# Patient Record
Sex: Male | Born: 1945 | Race: White | Hispanic: No | Marital: Married | Smoking: Never smoker
Health system: Southern US, Community
[De-identification: ages and names within clinical notes are randomized; demographics above are authoritative.]

## PROBLEM LIST (undated history)

## (undated) DIAGNOSIS — S82301A Unspecified fracture of lower end of right tibia, initial encounter for closed fracture: Secondary | ICD-10-CM

## (undated) DIAGNOSIS — I609 Nontraumatic subarachnoid hemorrhage, unspecified: Secondary | ICD-10-CM

## (undated) DIAGNOSIS — C4491 Basal cell carcinoma of skin, unspecified: Secondary | ICD-10-CM

## (undated) DIAGNOSIS — S2249XA Multiple fractures of ribs, unspecified side, initial encounter for closed fracture: Secondary | ICD-10-CM

## (undated) DIAGNOSIS — S022XXA Fracture of nasal bones, initial encounter for closed fracture: Secondary | ICD-10-CM

## (undated) DIAGNOSIS — Z87442 Personal history of urinary calculi: Secondary | ICD-10-CM

## (undated) DIAGNOSIS — I6200 Nontraumatic subdural hemorrhage, unspecified: Secondary | ICD-10-CM

## (undated) DIAGNOSIS — S8261XA Displaced fracture of lateral malleolus of right fibula, initial encounter for closed fracture: Secondary | ICD-10-CM

## (undated) DIAGNOSIS — H9190 Unspecified hearing loss, unspecified ear: Secondary | ICD-10-CM

## (undated) DIAGNOSIS — J939 Pneumothorax, unspecified: Secondary | ICD-10-CM

## (undated) DIAGNOSIS — I82403 Acute embolism and thrombosis of unspecified deep veins of lower extremity, bilateral: Secondary | ICD-10-CM

## (undated) HISTORY — PX: COLONOSCOPY: SHX174

## (undated) HISTORY — PX: TONSILLECTOMY: SUR1361

## (undated) HISTORY — PX: OTHER SURGICAL HISTORY: SHX169

## (undated) HISTORY — PX: SKIN CANCER EXCISION: SHX779

---

## 2006-11-11 HISTORY — PX: TOTAL KNEE ARTHROPLASTY: SHX125

## 2007-01-27 ENCOUNTER — Inpatient Hospital Stay (HOSPITAL_COMMUNITY): Admission: RE | Admit: 2007-01-27 | Discharge: 2007-01-30 | Payer: Self-pay | Admitting: Orthopaedic Surgery

## 2007-11-12 HISTORY — PX: TOTAL KNEE ARTHROPLASTY: SHX125

## 2007-11-12 HISTORY — PX: CHOLECYSTECTOMY: SHX55

## 2008-04-25 ENCOUNTER — Ambulatory Visit: Admission: RE | Admit: 2008-04-25 | Discharge: 2008-04-25 | Payer: Self-pay | Admitting: Orthopaedic Surgery

## 2008-05-19 ENCOUNTER — Inpatient Hospital Stay (HOSPITAL_COMMUNITY): Admission: RE | Admit: 2008-05-19 | Discharge: 2008-05-22 | Payer: Self-pay | Admitting: Orthopaedic Surgery

## 2011-03-26 NOTE — Op Note (Signed)
NAME:  Ryan Shah, Ryan Shah               ACCOUNT NO.:  [id number]   MEDICAL RECORD NO.:  [id number]          PATIENT TYPE:  INP   LOCATION:  5041                         FACILITY:  [facility]   PHYSICIAN:  Lubertha Basque. Dalldorf, M.D.DATE OF BIRTH:  [birth year]   DATE OF PROCEDURE:  05/19/2008  DATE OF DISCHARGE:                               OPERATIVE REPORT   PREOPERATIVE DIAGNOSIS:  Left knee degenerative joint disease.   POSTOPERATIVE DIAGNOSIS:  Left knee degenerative joint disease.   PROCEDURE:  Left total knee replacement.   ANESTHESIA:  General and block.   ATTENDING SURGEON:  Lubertha Basque. 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  It is about a year from a  successful knee replacement on the opposite side.  He has failed  conservative measures on the left including oral anti-inflammatories and  injectables.  Pain was limiting his ability to walk and rest.  He has  bone-on-bone contact on x-ray and is offered a knee replacement  operation.  Informed operative consent was obtained after discussion of  possible complications including reaction to anesthesia, infection, DVT,  PE, and death.  The importance of the postoperative rehabilitation  protocol to optimize result was also stressed with the patient.   SUMMARY FINDINGS AND PROCEDURE:  Under general anesthesia and a knee  block, a left knee replacement was performed.  He had advanced  degenerative change in medial and patellofemoral compartments and  excellent bone quality.  We addressed his problem with parts similar to  the opposite side with the DePuy system utilized.  We used a 6 MBT  tibial tray with a 13 x 30 stem.  We used 10-mm deep dish spacer and a  large plus femur with a 41 mm all-polyethylene patella.  I did include  antibiotic in the cement.  Bryna Colander assisted throughout and was  invaluable to the  completion of the case and that he helped position and  retract while I performed the procedure.  He also closed simultaneously  to help minimize OR time.   DESCRIPTION OF PROCEDURE:  The patient was taken to the operative suite  where general anesthetic was applied without difficulty.  He was also  given a block in the preanesthesia area.  He was positioned supine and  prepped and draped in normal sterile fashion.  After administration of  IV Kefzol, the left leg was elevated, exsanguinated, and tourniquet was  inflated about the thigh.  A longitudinal anterior incision was made  with dissection down to the extensor mechanism.  All appropriate anti-  infected measures were used including Betadine impregnated drape, closed  hooded exhaust systems for each member of surgical team, and  preoperative IV antibiotic.  A medial parapatellar incision was made.  The kneecap was flipped and the knee flexed.  Findings were as noted  above.  An intramedullary guide was placed in the tibia to make cut with  a slight posterior tilt.  An intramedullary guide was then placed in  the  femur to make anterior and posterior cuts creating a flexion gap of 10  mm.  A second intramedullary guide was placed in the femur to make a  distal cut creating equal extension gap of 10 mm balancing the knee.  We  did perform a slight soft tissue release off the proximal medial tibia  to address his mild varus deformity.  The femur sized to a large plus  and the tibia to a 6.  The appropriate guides were placed and utilized.  We elected to place a stem on the tibia due to his stature.  The  appropriate guides were placed there and utilized.  The trial reduction  was done of these components with a 10 spacer and knee easily came to  full extension and flexed well.  The patella was cut down thickness by  about 12 mm to 14 and appropriate guides were placed and utilized for  41.  Trial components removed followed by pulsatile  lavage irrigation of  all bones.  Cement was mixed including Zinacef and was pressurized onto  the bones followed by placement of the aforementioned DePuy components.  Excess cement was trimmed and pressure was held in component until  cement had hardened.  The tourniquet was deflated and a small amount of  bleeding was easily controlled by Bovie cautery.  The knee was irrigated  followed by placement of a drain exiting superolaterally.  The extensor  mechanism reapproximated with #1 Vicryl interrupted fashion followed by  subcutaneous reapproximation with 0 and 2-0 undyed Vicryl and skin was  closed with staples.  He easily flexed to 120 against gravity at the end  of the case.  Adaptic was applied followed by dry gauze and loose Ace  wrap.  Estimated blood loss and fluids can be obtained from anesthesia  records as can accurate tourniquet time.   DISPOSITION:  The patient was extubated in the operating room and taken  to the recovery in stable addition.  He has been admitted to the  Orthopedic Surgery Service for appropriate postop care to include  perioperative antibiotics and Coumadin plus Lovenox for DVT prophylaxis.      Lubertha Basque Jerl Santos, M.D.  Electronically Signed     PGD/MEDQ  D:  05/19/2008  T:  05/20/2008  Job:  [id number]

## 2011-03-29 NOTE — Op Note (Signed)
NAME:  Ryan Shah, Ryan Shah               ACCOUNT NO.:  [id number]   MEDICAL RECORD NO.:  [id number]          PATIENT TYPE:  INP   LOCATION:  2550                         FACILITY:  [facility]   PHYSICIAN:  Lubertha Basque. Dalldorf, M.D.DATE OF BIRTH:  08/19/1946   DATE OF PROCEDURE:  01/27/2007  DATE OF DISCHARGE:                               OPERATIVE REPORT   PREOPERATIVE DIAGNOSIS:  Right knee degenerative arthritis.   POSTOPERATIVE DIAGNOSIS:  Right knee degenerative arthritis.   PROCEDURE:  Right total knee replacement.   ANESTHESIA:  General.   ATTENDING SURGEON:  Lubertha Basque. Jerl Santos, M.D.   ASSISTANT:  Lindwood Qua, P.A.-C.   INDICATIONS FOR PROCEDURE:  The patient is a 65 year old male with a  long history of bilateral knee arthritis.  This has persisted despite  oral anti-inflammatories and multiple injections.  He has had an  arthroscopy on the right which revealed degenerative changes which were  near end stage many years ago.  He also has had an open procedure on the  left which sounds like a meniscectomy.  At this point, his pain limits  his ability to walk and rest and he is offered knee replacement on the  most painful side, which is the right.  Informed operative consent was  obtained after a discussion of the possible complications of reaction to  anesthesia, infection, DVT, PE, and death.   SUMMARY OF FINDINGS AND PROCEDURE:  Under general anesthesia through an  anterior approach, a right total knee replacement was performed.  The  patient had severe degenerative change medial and patellofemoral.  He  had excellent bone quality.  We addressed this problem with a cemented  DePuy system using a large plus femur, 41 all polyethylene patella, 10  mm deep dish spacer, and a size 5 MBT revision stem tibial tray.  Bryna Colander assisted throughout and was invaluable to the completion of the  case in that he helped position and retract while I performed the  procedure.  He also  closed simultaneously to help minimize OR time.  We  did place antibiotic in the cement.   DESCRIPTION OF PROCEDURE:  The patient was brought to the operating  suite where general anesthetic was applied without difficulty.  He was  positioned supine and prepped and draped in a normal sterile fashion.  After the administration of IV Kefzol, the right leg was elevated,  exsanguinated, and the tourniquet inflated about the thigh.  A  longitudinal anterior incision was made with dissection down to the  extensor mechanism.  All appropriate anti-infective measures were used  including closed hooded exhaust systems for each member of the surgical  team, Betadine impregnated drape, and the preoperative IV antibiotic. A  medial parapatellar incision was made and the kneecap flipped with the  knee flexed.  Some residual meniscal tissues were removed along with the  intact ACL and PCL.  He had advanced degenerative changes as described  above.  The bone quality was excellent.  Due to the patient's  significant size, we elected to perform a stemmed tibial component.  We  placed an intramedullary guide  in the tibia and utilized this to make a  cut with a very slight posterior tilt.  An intramedullary guide was then  placed in the femur to make anterior and posterior cuts creating a  flexion gap of 10 mm.  A second intramedullary guide was placed in the  femur to make a distal cut creating an equal extension gap of 10 mm  balancing the knee.  He did have a slight flexion contracture so we set  him up fairly loose.  The femur sized to a large plus while the tibia  sized to a 5 and the appropriate guides were placed and utilized.  The  patella was cut down in thickness by 10 mm to 17 and sized to a 41 with  the appropriate guide placed and utilized.  A trial reduction was done  with these components and the 10 mm spacer.  He easily came to slight  hyperextension and flexed well with the kneecap tracking  in a normal  position.  The trial components were removed and all cut bony surfaces  were lavaged with pulsatile solution.  We mixed the cement including  Zinacef antibiotic and pressurized this onto all three cut bony  surfaces.  We then placed the aforementioned components.  Excess cement  was trimmed and pressure was held on the components until the cement had  hardened.  The tourniquet was deflated and a small amount of bleeding  was easily controlled with Bovie cautery.  The knee was thoroughly  irrigated followed by placement of a drain exiting superolaterally.  The  extensor mechanism was reapproximated with #1 Vicryl in an interrupted  fashion.  The subcutaneous tissues were reapproximated in two layers  with 0 and 2-0 undyed Vicryl followed by skin closure with staples.  Adaptic was applied to the wound followed by dry gauze and loose Ace  wrap.  Estimated blood loss and interoperative fluids could  be obtained  from anesthesia records as can accurate tourniquet time which was about  1 hour.   DISPOSITION:  The patient was extubated in the operating room and taken  to the recovery room in stable addition.  He was admitted to the  orthopedic surgery service for the appropriate postop care to include  perioperative antibiotics and Coumadin plus Lovenox for DVT prophylaxis.      Lubertha Basque Jerl Santos, M.D.  Electronically Signed     PGD/MEDQ  D:  01/27/2007  T:  01/27/2007  Job:  [id number]

## 2011-03-29 NOTE — Discharge Summary (Signed)
NAME:  Ryan Shah, Ryan Shah               ACCOUNT NO.:  [id number]   MEDICAL RECORD NO.:  [id number]          PATIENT TYPE:  INP   LOCATION:  5009                         FACILITY:  [facility]   PHYSICIAN:  Lubertha Basque. Dalldorf, M.D.DATE OF BIRTH:  01-21-1946   DATE OF ADMISSION:  01/27/2007  DATE OF DISCHARGE:  01/30/2007                               DISCHARGE SUMMARY   ADMITTING DIAGNOSIS:  1. Right knee end-stage DJD.  2. Left knee degeneration.   BRIEF HISTORY:  Ryan Shah is a 65 year old white male patient known to  our practice who has been having increasing bilateral knee pain.  The  right is certainly worse than the left.  He is now having pain when he  walks, trouble sleeping at night time.  He has been on oral anti-  inflammatory medications with little benefit.  His x-rays reveal end-  stage degeneration of his right knee, bone-on-bone arthritis.  We have  discussed treatment options with that being knee replacement and the  risk of anesthesia, infection, DVT and possible death.   PERTINENT LABORATORY AND X-RAY FINDINGS:  Sodium 136, potassium 3.7,  glucose 113, BUN 7.  Creatinine 0.83, ProTime's drawn serially as he was  on low-dose Coumadin protocol.  WBC 10.6, RBC is 3.94, hemoglobin 12.3.  EKG sinus bradycardia with first-degree AV block.   COURSE IN THE HOSPITAL:  Ryan Shah was admitted postoperatively placed  on IV Ancef 1 gram q.8 h. He was also put on morphine PCA pump,  knee-  high TED's incentive spirometry and then out of bed that evening with  physical therapy and could be weightbearing as tolerated throughout his  hospital stay. He had a variety of oral pain medications which included  ferrous sulfate, acetaminophen and Percocet.  It was also incorporated  the use of a CPM machine and he was on low-dose Coumadin protocol as  well as Lovenox protocol for DVT prophylaxis. The days in the hospital,  the first day postop his blood pressure was 123/73, temperature 98, was  voiding okay, Hemovac drain was functioning.  Lungs were clear.  Abdomen  was soft throughout his hospital stay.  No calf pain.  Dressing changed  and drain was pulled on the second day postop and he was noted to have  no sign of infection. His motion was 0-70 on the CPM machine the second  day. The third day postop he was eating and voiding. His pain was  controlled on oral pain medications.  Home Care for therapy and  ProTime's were arranged.  He was discharged home.   CONDITION ON DISCHARGE:  Improved.   FOLLOW-UP:  He can be out of work.  He is on a low sodium, heart healthy  diet.  May change his dressing daily.  Is up with crutches or walker  assistance. Return to see Dr. Jerl Santos in 10 days and to call our office  at [phone] for  that appointment. Also if there is any sign of infection to call that  same number.  [facility] will provide ProTime's for his Coumadin  as well as physical therapy.  He is given a prescription for Percocet  one or two every 4-6 hours as needed and then a prescription for  Coumadin dose regulated by pharmacy for 2 weeks.      Lindwood Qua, P.A.      Lubertha Basque Jerl Santos, M.D.  Electronically Signed    MC/MEDQ  D:  01/30/2007  T:  01/30/2007  Job:  [id number]

## 2011-03-29 NOTE — Op Note (Signed)
NAME:  Ryan Shah, Ryan Shah               ACCOUNT NO.:  [id number]   MEDICAL RECORD NO.:  [id number]          PATIENT TYPE:  INP   LOCATION:  2550                         FACILITY:  [facility]   PHYSICIAN:  Lubertha Basque. Dalldorf, M.D.DATE OF BIRTH:  1946/06/07   DATE OF PROCEDURE:  DATE OF DISCHARGE:                               OPERATIVE REPORT   NO DICTATION      Lubertha Basque. Jerl Santos, M.D.     PGD/MEDQ  D:  01/27/2007  T:  01/27/2007  Job:  [id number]

## 2011-03-29 NOTE — Discharge Summary (Signed)
NAME:  Santaella, Nilay               ACCOUNT NO.:  [id number]   MEDICAL RECORD NO.:  [id number]          PATIENT TYPE:  INP   LOCATION:  5041                         FACILITY:  [facility]   PHYSICIAN:  Lubertha Basque. Dalldorf, M.D.DATE OF BIRTH:  [birth year]   DATE OF ADMISSION:  05/19/2008  DATE OF DISCHARGE:  05/22/2008                               DISCHARGE SUMMARY   ADMITTING DIAGNOSES:  1. End-stage degenerative joint disease, left knee  2. Status post right total knee replacement.   BRIEF HISTORY:  Mr. Ryan Shah is a 65 year old white male patient well known  to our practice about a year or so ago.  We had replaced his right knee  which has done fantastically.  His left knee is bothering him now.  He  has end-stage DJD on x-ray.  We would like to proceed on with a left  knee replacement.   </   PERTINENT LABORATORY AND X-RAY FINDINGS:  EKG, sinus bradycardia, first-  degree AV block,  WBCs 9.8, hemoglobin 13.0, hematocrit 37.8, and  platelets 161,000.  Serial INRs were done as he is on low-dose Coumadin  protocol for DVT prophylaxis.  Sodium 137, potassium 3.8, glucose 98,  BUN 8, and creatinine 0.93.   COURSE IN THE HOSPITAL:  He was admitted postoperatively.  Coumadin,  Lovenox, and DVT prophylaxis protocol per pharmacy.  IV Ancef 1 g q.8 h.  x3 doses and then a Dilaudid pump PCA.  Kept on other p.o. medicines  including pain medicine, antiemetics, IV p.o.  Foley catheter for the  first day postop, CPM machine, knee-high TEDs, and incentive spirometry  the first day postop.  He was doing fine.  Pain was controlled by  analgesics.  Lungs were clear.  Abdomen was soft.  Temperature 97, pulse  regular, and blood pressure 93/58.  Voiding okay.  PCA was working, and  he was in the [employer].  Dressing will be changed in the next day.  On the second day postop, his wound was benign.  Hemovac was pulled.  No  sign of infection or irritation.  No adverse feature noted.  Heplocked  is IV.  He  was weightbearing as tolerated and was discharged home in  extubated condition.  Discharge is improved.   FOLLOWUP:  He was kept on Percocet as needed for pain one or two q.4-6  and Coumadin for 2 weeks according to pharmacy protocol.  Home therapy  and INR is arranged through home agency.  He may change his dressing  daily.  Low-sodium, heart-healthy diet.  Crutches, weightbearing as  tolerated, and return to our office [phone] in 10 days.      Lindwood Qua, P.A.      Lubertha Basque Jerl Santos, M.D.  Electronically Signed    MC/MEDQ  D:  06/28/2008  T:  06/28/2008  Job:  [phone] [phone]

## 2011-08-08 LAB — BASIC METABOLIC PANEL
BUN: 10
CO2: 29
CO2: 32
Calcium: 8 — ABNORMAL LOW
Chloride: 100
Chloride: 101
GFR calc Af Amer: >60
GFR calc Af Amer: >60
GFR calc Af Amer: >60
GFR calc non Af Amer: >60
GFR calc non Af Amer: >60
Potassium: 3.8
Potassium: 3.8
Potassium: 4
Sodium: 137
Sodium: 138

## 2011-08-08 LAB — DIFFERENTIAL
Eosinophils Absolute: 0.1
Lymphocytes Relative: 39
Lymphs Abs: 2.8
Monocytes Relative: 9
Neutro Abs: 3.7
Neutrophils Relative %: 51

## 2011-08-08 LAB — CBC
HCT: 37.8 — ABNORMAL LOW
HCT: 38.7 — ABNORMAL LOW
HCT: 39.4
Hemoglobin: 13
Hemoglobin: 13.1
MCHC: 34
MCHC: 33.8
MCV: 91.5
MCV: 91.1
MCV: 91.6
MCV: 91.6
Platelets: 199
Platelets: 171
Platelets: 210
RBC: 4.81
RBC: 4.12 — ABNORMAL LOW
RBC: 4.19 — ABNORMAL LOW
RBC: 4.3
RBC: 5.17
RDW: 13.1
WBC: 11.3 — ABNORMAL HIGH
WBC: 7.2
WBC: 9.8

## 2011-08-08 LAB — PROTIME-INR
INR: 1.1
INR: 1.8 — ABNORMAL HIGH
Prothrombin Time: 14.6

## 2013-04-02 DIAGNOSIS — S82301A Unspecified fracture of lower end of right tibia, initial encounter for closed fracture: Secondary | ICD-10-CM

## 2013-04-02 DIAGNOSIS — S81809A Unspecified open wound, unspecified lower leg, initial encounter: Secondary | ICD-10-CM

## 2013-04-02 DIAGNOSIS — S82209A Unspecified fracture of shaft of unspecified tibia, initial encounter for closed fracture: Secondary | ICD-10-CM | POA: Insufficient documentation

## 2013-04-02 HISTORY — DX: Unspecified open wound, unspecified lower leg, initial encounter: S81.809A

## 2013-04-02 HISTORY — DX: Unspecified fracture of lower end of right tibia, initial encounter for closed fracture: S82.301A

## 2013-04-08 ENCOUNTER — Ambulatory Visit (HOSPITAL_COMMUNITY): Payer: Medicare Other | Admitting: Anesthesiology

## 2013-04-08 ENCOUNTER — Encounter: Payer: Self-pay | Admitting: Orthopedic Surgery

## 2013-04-08 ENCOUNTER — Ambulatory Visit (HOSPITAL_COMMUNITY): Payer: Medicare Other

## 2013-04-08 ENCOUNTER — Ambulatory Visit (HOSPITAL_COMMUNITY)
Admission: AD | Admit: 2013-04-08 | Discharge: 2013-04-08 | Disposition: A | Discharge status: Home / Self Care | Payer: Medicare Other | Source: Ambulatory Visit | Attending: Orthopedic Surgery | Admitting: Orthopedic Surgery

## 2013-04-08 ENCOUNTER — Encounter (HOSPITAL_COMMUNITY)
Admission: AD | Disposition: A | Discharge status: Home / Self Care | Payer: Self-pay | Source: Ambulatory Visit | Attending: Orthopedic Surgery

## 2013-04-08 ENCOUNTER — Encounter (HOSPITAL_COMMUNITY): Payer: Self-pay | Admitting: Anesthesiology

## 2013-04-08 DIAGNOSIS — S82209A Unspecified fracture of shaft of unspecified tibia, initial encounter for closed fracture: Secondary | ICD-10-CM | POA: Insufficient documentation

## 2013-04-08 DIAGNOSIS — S93439A Sprain of tibiofibular ligament of unspecified ankle, initial encounter: Secondary | ICD-10-CM | POA: Insufficient documentation

## 2013-04-08 DIAGNOSIS — S93431A Sprain of tibiofibular ligament of right ankle, initial encounter: Secondary | ICD-10-CM

## 2013-04-08 DIAGNOSIS — S82871A Displaced pilon fracture of right tibia, initial encounter for closed fracture: Secondary | ICD-10-CM

## 2013-04-08 DIAGNOSIS — I739 Peripheral vascular disease, unspecified: Secondary | ICD-10-CM | POA: Insufficient documentation

## 2013-04-08 DIAGNOSIS — Z7901 Long term (current) use of anticoagulants: Secondary | ICD-10-CM | POA: Insufficient documentation

## 2013-04-08 DIAGNOSIS — Z86718 Personal history of other venous thrombosis and embolism: Secondary | ICD-10-CM | POA: Insufficient documentation

## 2013-04-08 DIAGNOSIS — Z87820 Personal history of traumatic brain injury: Secondary | ICD-10-CM | POA: Insufficient documentation

## 2013-04-08 HISTORY — DX: Nontraumatic subdural hemorrhage, unspecified: I62.00

## 2013-04-08 HISTORY — DX: Acute embolism and thrombosis of unspecified deep veins of lower extremity, bilateral: I82.403

## 2013-04-08 HISTORY — DX: Displaced fracture of lateral malleolus of right fibula, initial encounter for closed fracture: S82.61XA

## 2013-04-08 HISTORY — DX: Nontraumatic subarachnoid hemorrhage, unspecified: I60.9

## 2013-04-08 HISTORY — DX: Personal history of urinary calculi: Z87.442

## 2013-04-08 HISTORY — DX: Pneumothorax, unspecified: J93.9

## 2013-04-08 HISTORY — DX: Fracture of nasal bones, initial encounter for closed fracture: S02.2XXA

## 2013-04-08 HISTORY — DX: Multiple fractures of ribs, unspecified side, initial encounter for closed fracture: S22.49XA

## 2013-04-08 HISTORY — DX: Unspecified fracture of lower end of right tibia, initial encounter for closed fracture: S82.301A

## 2013-04-08 HISTORY — DX: Rider (driver) (passenger) of other motorcycle injured in unspecified traffic accident, initial encounter: V29.99XA

## 2013-04-08 HISTORY — PX: ORIF ANKLE FRACTURE: SHX5408

## 2013-04-08 HISTORY — DX: Basal cell carcinoma of skin, unspecified: C44.91

## 2013-04-08 LAB — CBC WITH DIFFERENTIAL/PLATELET
Eosinophils Absolute: 0.1 10*3/uL (ref 0.0–0.7)
Eosinophils Relative: 1 % (ref 0–5)
HCT: 39.4 % (ref 39.0–52.0)
Hemoglobin: 13.2 g/dL (ref 13.0–17.0)
Lymphs Abs: 2.1 10*3/uL (ref 0.7–4.0)
MCH: 30.8 pg (ref 26.0–34.0)
MCV: 92.1 fL (ref 78.0–100.0)
Monocytes Absolute: 0.7 10*3/uL (ref 0.1–1.0)
Monocytes Relative: 9 % (ref 3–12)
Neutrophils Relative %: 62 % (ref 43–77)
RBC: 4.28 MIL/uL (ref 4.22–5.81)

## 2013-04-08 LAB — PROTIME-INR: Prothrombin Time: 14.5 seconds (ref 11.6–15.2)

## 2013-04-08 LAB — COMPREHENSIVE METABOLIC PANEL
Alkaline Phosphatase: 214 U/L — ABNORMAL HIGH (ref 39–117)
BUN: 16 mg/dL (ref 6–23)
GFR calc Af Amer: 79 mL/min — ABNORMAL LOW (ref >90)
Glucose, Bld: 98 mg/dL (ref 70–99)
Potassium: 4.4 mEq/L (ref 3.5–5.1)
Total Protein: 7 g/dL (ref 6.0–8.3)

## 2013-04-08 LAB — SURGICAL PCR SCREEN: Staphylococcus aureus: NEGATIVE

## 2013-04-08 SURGERY — OPEN REDUCTION INTERNAL FIXATION (ORIF) ANKLE FRACTURE
Anesthesia: General | Site: Ankle | Laterality: Right | Wound class: Clean

## 2013-04-08 MED ORDER — CEFAZOLIN SODIUM-DEXTROSE 2-3 GM-% IV SOLR
INTRAVENOUS | Status: AC
  Administered 2013-04-08: 2 g via INTRAVENOUS
  Filled 2013-04-08: qty 50

## 2013-04-08 MED ORDER — METHOCARBAMOL 500 MG PO TABS: 500.0000 mg | ORAL_TABLET | Freq: Four times a day (QID) | ORAL | Status: DC | PRN

## 2013-04-08 MED ORDER — MUPIROCIN 2 % EX OINT: TOPICAL_OINTMENT | Freq: Two times a day (BID) | CUTANEOUS | Status: DC

## 2013-04-08 MED ORDER — BUPIVACAINE HCL (PF) 0.25 % IJ SOLN
INTRAMUSCULAR | Status: DC | PRN
  Administered 2013-04-08: 10 mL

## 2013-04-08 MED ORDER — HYDROCODONE-ACETAMINOPHEN 5-325 MG PO TABS: 1.0000 | ORAL_TABLET | Freq: Four times a day (QID) | ORAL | Status: DC | PRN

## 2013-04-08 MED ORDER — BUPIVACAINE HCL (PF) 0.25 % IJ SOLN
INTRAMUSCULAR | Status: AC
  Filled 2013-04-08: qty 30

## 2013-04-08 MED ORDER — PROPOFOL 10 MG/ML IV BOLUS
INTRAVENOUS | Status: DC | PRN
  Administered 2013-04-08: 50 mg via INTRAVENOUS
  Administered 2013-04-08: 150 mg via INTRAVENOUS

## 2013-04-08 MED ORDER — MUPIROCIN 2 % EX OINT
TOPICAL_OINTMENT | CUTANEOUS | Status: AC
  Filled 2013-04-08: qty 22

## 2013-04-08 MED ORDER — HYDROMORPHONE HCL PF 1 MG/ML IJ SOLN
INTRAMUSCULAR | Status: AC
  Filled 2013-04-08: qty 2

## 2013-04-08 MED ORDER — ROCURONIUM BROMIDE 100 MG/10ML IV SOLN
INTRAVENOUS | Status: DC | PRN
  Administered 2013-04-08: 30 mg via INTRAVENOUS
  Administered 2013-04-08 (×2): 10 mg via INTRAVENOUS

## 2013-04-08 MED ORDER — FENTANYL CITRATE 0.05 MG/ML IJ SOLN
INTRAMUSCULAR | Status: DC | PRN
  Administered 2013-04-08: 100 ug via INTRAVENOUS
  Administered 2013-04-08 (×3): 50 ug via INTRAVENOUS

## 2013-04-08 MED ORDER — ONDANSETRON HCL 4 MG/2ML IJ SOLN
INTRAMUSCULAR | Status: DC | PRN
  Administered 2013-04-08: 4 mg via INTRAVENOUS

## 2013-04-08 MED ORDER — 0.9 % SODIUM CHLORIDE (POUR BTL) OPTIME
TOPICAL | Status: DC | PRN
  Administered 2013-04-08: 1000 mL

## 2013-04-08 MED ORDER — LACTATED RINGERS IV SOLN: INTRAVENOUS | Status: DC

## 2013-04-08 MED ORDER — CEFAZOLIN SODIUM-DEXTROSE 2-3 GM-% IV SOLR
INTRAVENOUS | Status: AC
  Filled 2013-04-08: qty 50

## 2013-04-08 MED ORDER — EPHEDRINE SULFATE 50 MG/ML IJ SOLN
INTRAMUSCULAR | Status: DC | PRN
  Administered 2013-04-08: 5 mg via INTRAVENOUS

## 2013-04-08 MED ORDER — LIDOCAINE HCL (CARDIAC) 20 MG/ML IV SOLN
INTRAVENOUS | Status: DC | PRN
  Administered 2013-04-08: 60 mg via INTRAVENOUS

## 2013-04-08 MED ORDER — NEOSTIGMINE METHYLSULFATE 1 MG/ML IJ SOLN
INTRAMUSCULAR | Status: DC | PRN
  Administered 2013-04-08: 5 mg via INTRAVENOUS

## 2013-04-08 MED ORDER — CEFAZOLIN SODIUM-DEXTROSE 2-3 GM-% IV SOLR: 2.0000 g | INTRAVENOUS | Status: DC

## 2013-04-08 MED ORDER — HYDROMORPHONE HCL PF 1 MG/ML IJ SOLN
0.2500 mg | INTRAMUSCULAR | Status: DC | PRN
  Administered 2013-04-08: 0.5 mg via INTRAVENOUS

## 2013-04-08 MED ORDER — GLYCOPYRROLATE 0.2 MG/ML IJ SOLN
INTRAMUSCULAR | Status: DC | PRN
  Administered 2013-04-08: .6 mg via INTRAVENOUS

## 2013-04-08 MED ORDER — MIDAZOLAM HCL 5 MG/5ML IJ SOLN
INTRAMUSCULAR | Status: DC | PRN
  Administered 2013-04-08: 2 mg via INTRAVENOUS

## 2013-04-08 MED ORDER — ONDANSETRON HCL 4 MG PO TABS: 4.0000 mg | ORAL_TABLET | Freq: Three times a day (TID) | ORAL | Status: DC | PRN

## 2013-04-08 MED ORDER — LACTATED RINGERS IV SOLN
INTRAVENOUS | Status: DC
  Administered 2013-04-08 (×2): via INTRAVENOUS

## 2013-04-08 MED ORDER — OXYCODONE HCL 5 MG PO TABS: 5.0000 mg | ORAL_TABLET | ORAL | Status: DC | PRN

## 2013-04-08 MED ORDER — ONDANSETRON HCL 4 MG/2ML IJ SOLN: 4.0000 mg | Freq: Once | INTRAMUSCULAR | Status: DC | PRN

## 2013-04-08 SURGICAL SUPPLY — 68 items
BANDAGE ELASTIC 4 VELCRO ST LF (GAUZE/BANDAGES/DRESSINGS) ×2 IMPLANT
BANDAGE ELASTIC 6 VELCRO ST LF (GAUZE/BANDAGES/DRESSINGS) ×2 IMPLANT
BANDAGE ESMARK 6X9 LF (GAUZE/BANDAGES/DRESSINGS) ×1 IMPLANT
BANDAGE GAUZE ELAST BULKY 4 IN (GAUZE/BANDAGES/DRESSINGS) ×3 IMPLANT
BIT DRILL 2.5X110 QC LCP DISP (BIT) ×1 IMPLANT
BIT DRILL QC 3.5X110 (BIT) ×1 IMPLANT
BLADE SURG 10 STRL SS (BLADE) ×2 IMPLANT
BNDG CMPR 9X6 STRL LF SNTH (GAUZE/BANDAGES/DRESSINGS) ×1
BNDG COHESIVE 4X5 TAN STRL (GAUZE/BANDAGES/DRESSINGS) ×2 IMPLANT
BNDG ESMARK 6X9 LF (GAUZE/BANDAGES/DRESSINGS) ×2
BRUSH SCRUB DISP (MISCELLANEOUS) ×4 IMPLANT
CLOTH BEACON ORANGE TIMEOUT ST (SAFETY) ×2 IMPLANT
COVER SURGICAL LIGHT HANDLE (MISCELLANEOUS) ×4 IMPLANT
DRAPE C-ARM 42X72 X-RAY (DRAPES) IMPLANT
DRAPE C-ARMOR (DRAPES) ×2 IMPLANT
DRAPE ORTHO SPLIT 77X108 STRL (DRAPES) ×4
DRAPE PROXIMA HALF (DRAPES) ×2 IMPLANT
DRAPE SURG ORHT 6 SPLT 77X108 (DRAPES) ×3 IMPLANT
DRAPE U-SHAPE 47X51 STRL (DRAPES) ×2 IMPLANT
DRSG ADAPTIC 3X8 NADH LF (GAUZE/BANDAGES/DRESSINGS) ×1 IMPLANT
DRSG EMULSION OIL 3X3 NADH (GAUZE/BANDAGES/DRESSINGS) IMPLANT
ELECT REM PT RETURN 9FT ADLT (ELECTROSURGICAL) ×2
ELECTRODE REM PT RTRN 9FT ADLT (ELECTROSURGICAL) ×1 IMPLANT
GLOVE BIO SURGEON STRL SZ7 (GLOVE) ×2 IMPLANT
GLOVE BIO SURGEON STRL SZ7.5 (GLOVE) ×2 IMPLANT
GLOVE BIO SURGEON STRL SZ8 (GLOVE) ×2 IMPLANT
GLOVE BIOGEL PI IND STRL 7.5 (GLOVE) ×1 IMPLANT
GLOVE BIOGEL PI IND STRL 8 (GLOVE) ×1 IMPLANT
GLOVE BIOGEL PI INDICATOR 7.5 (GLOVE) ×1
GLOVE BIOGEL PI INDICATOR 8 (GLOVE) ×1
GLOVE ECLIPSE 6.5 STRL STRAW (GLOVE) ×1 IMPLANT
GOWN PREVENTION PLUS XLARGE (GOWN DISPOSABLE) ×2 IMPLANT
GOWN STRL NON-REIN LRG LVL3 (GOWN DISPOSABLE) ×4 IMPLANT
KIT BASIN OR (CUSTOM PROCEDURE TRAY) ×2 IMPLANT
KIT ROOM TURNOVER OR (KITS) ×2 IMPLANT
MANIFOLD NEPTUNE II (INSTRUMENTS) ×1 IMPLANT
NDL HYPO 21X1.5 SAFETY (NEEDLE) IMPLANT
NEEDLE HYPO 21X1.5 SAFETY (NEEDLE) ×2 IMPLANT
NS IRRIG 1000ML POUR BTL (IV SOLUTION) ×2 IMPLANT
PACK GENERAL/GYN (CUSTOM PROCEDURE TRAY) ×2 IMPLANT
PAD ARMBOARD 7.5X6 YLW CONV (MISCELLANEOUS) ×4 IMPLANT
PAD CAST 4YDX4 CTTN HI CHSV (CAST SUPPLIES) IMPLANT
PADDING CAST COTTON 4X4 STRL (CAST SUPPLIES) ×8
PADDING CAST COTTON 6X4 STRL (CAST SUPPLIES) ×2 IMPLANT
PENCIL BUTTON HOLSTER BLD 10FT (ELECTRODE) ×2 IMPLANT
SCREW CORTEX 3.5 55MM (Screw) ×4 IMPLANT
SCREW CORTEX 3.5 60MM (Screw) ×2 IMPLANT
SCREW CORTEX 3.5 65MM (Screw) ×1 IMPLANT
SCREW CORTEX 3.5 70MM SELF TAP (Screw) ×1 IMPLANT
SPLINT PLASTER CAST XFAST 5X30 (CAST SUPPLIES) IMPLANT
SPLINT PLASTER XFAST SET 5X30 (CAST SUPPLIES) ×1
SPONGE GAUZE 4X4 12PLY (GAUZE/BANDAGES/DRESSINGS) ×1 IMPLANT
SPONGE LAP 18X18 X RAY DECT (DISPOSABLE) ×3 IMPLANT
SPONGE SCRUB IODOPHOR (GAUZE/BANDAGES/DRESSINGS) ×3 IMPLANT
STAPLER VISISTAT 35W (STAPLE) IMPLANT
SUCTION FRAZIER TIP 10 FR DISP (SUCTIONS) ×2 IMPLANT
SUT ETHILON 2 0 FS 18 (SUTURE) ×4 IMPLANT
SUT ETHILON 3 0 PS 1 (SUTURE) ×4 IMPLANT
SUT PDS AB 2-0 CT1 27 (SUTURE) IMPLANT
SUT VIC AB 2-0 CT1 27 (SUTURE) ×4
SUT VIC AB 2-0 CT1 TAPERPNT 27 (SUTURE) ×2 IMPLANT
SUT VIC AB 2-0 CT3 27 (SUTURE) IMPLANT
SYR CONTROL 10ML LL (SYRINGE) ×1 IMPLANT
TOWEL OR 17X24 6PK STRL BLUE (TOWEL DISPOSABLE) ×2 IMPLANT
TOWEL OR 17X26 10 PK STRL BLUE (TOWEL DISPOSABLE) ×4 IMPLANT
TUBE CONNECTING 12X1/4 (SUCTIONS) ×2 IMPLANT
UNDERPAD 30X30 INCONTINENT (UNDERPADS AND DIAPERS) ×2 IMPLANT
WATER STERILE IRR 1000ML POUR (IV SOLUTION) ×2 IMPLANT

## 2013-04-08 NOTE — Transfer of Care (Signed)
Immediate Anesthesia Transfer of Care Note  Patient: Ryan Shah  Procedure(s) Performed: Procedure(s) with comments: OPEN REDUCTION INTERNAL FIXATION (ORIF) ANKLE FRACTURE (Right) - repair pilon fracture and syndesmosis  Patient Location: PACU  Anesthesia Type:General  Level of Consciousness: sedated  Airway & Oxygen Therapy: Patient Spontanous Breathing and Patient connected to face mask oxygen  Post-op Assessment: Report given to PACU RN and Post -op Vital signs reviewed and stable  Post vital signs: Reviewed and stable  Complications: No apparent anesthesia complications

## 2013-04-08 NOTE — Anesthesia Procedure Notes (Signed)
Procedure Name: Intubation Date/Time: 04/08/2013 3:18 PM Performed by: Elon Alas Pre-anesthesia Checklist: Patient identified, Timeout performed, Emergency Drugs available, Suction available and Patient being monitored Patient Re-evaluated:Patient Re-evaluated prior to inductionOxygen Delivery Method: Circle system utilized Preoxygenation: Pre-oxygenation with 100% oxygen Intubation Type: IV induction Ventilation: Mask ventilation without difficulty and Oral airway inserted - appropriate to patient size Laryngoscope Size: Mac and 4 Grade View: Grade IV Tube type: Oral Tube size: 7.5 mm Number of attempts: 1 Airway Equipment and Method: Stylet Placement Confirmation: positive ETCO2,  ETT inserted through vocal cords under direct vision and breath sounds checked- equal and bilateral Secured at: 23 cm Tube secured with: Tape Dental Injury: Teeth and Oropharynx as per pre-operative assessment
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
## 2013-04-08 NOTE — Anesthesia Postprocedure Evaluation (Signed)
  Anesthesia Post-op Note  Patient: Ryan Shah  Procedure(s) Performed: Procedure(s) with comments: OPEN REDUCTION INTERNAL FIXATION (ORIF) ANKLE FRACTURE (Right) - repair pilon fracture and syndesmosis  Patient Location: PACU  Anesthesia Type:General  Level of Consciousness: awake, oriented and patient cooperative  Airway and Oxygen Therapy: Patient Spontanous Breathing  Post-op Pain: mild  Post-op Assessment: Post-op Vital signs reviewed, Patient's Cardiovascular Status Stable, Respiratory Function Stable, Patent Airway, No signs of Nausea or vomiting and Pain level controlled  Post-op Vital Signs: stable  Complications: No apparent anesthesia complications

## 2013-04-08 NOTE — Preoperative (Signed)
Beta Blockers   Reason not to administer Beta Blockers:Not Applicable 

## 2013-04-08 NOTE — Anesthesia Preprocedure Evaluation (Signed)
Anesthesia Evaluation  Patient identified by MRN, date of birth, ID band Patient awake    Reviewed: Allergy & Precautions, H&P , NPO status , Patient's Chart, lab work & pertinent test results  Airway       Dental   Pulmonary          Cardiovascular + Peripheral Vascular Disease     Neuro/Psych    GI/Hepatic   Endo/Other    Renal/GU      Musculoskeletal   Abdominal   Peds  Hematology   Anesthesia Other Findings   Reproductive/Obstetrics                           Anesthesia Physical Anesthesia Plan  ASA: I  Anesthesia Plan: General   Post-op Pain Management:    Induction: Intravenous  Airway Management Planned: LMA and Oral ETT  Additional Equipment:   Intra-op Plan:   Post-operative Plan: Extubation in OR  Informed Consent: I have reviewed the patients History and Physical, chart, labs and discussed the procedure including the risks, benefits and alternatives for the proposed anesthesia with the patient or authorized representative who has indicated his/her understanding and acceptance.     Plan Discussed with: CRNA, Anesthesiologist and Surgeon  Anesthesia Plan Comments:         Anesthesia Quick Evaluation

## 2013-04-09 ENCOUNTER — Encounter (HOSPITAL_COMMUNITY): Payer: Self-pay | Admitting: Orthopedic Surgery
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
## 2013-04-10 NOTE — H&P (Signed)
I have seen and examined the patient. I agree with the findings above.  I discussed with the patient the risks and benefits of surgery, including the possibility of infection, nerve injury, vessel injury, wound breakdown, arthritis, symptomatic hardware, DVT/ PE, loss of motion, and need for further surgery among others.  We also specifically discussed the elevated risk of soft tissue breakdown that could lead to amputation.  He understood these risks and wished to proceed.  Myrene Galas, MD [facility] [phone] (p)

## 2013-09-10 ENCOUNTER — Encounter (HOSPITAL_BASED_OUTPATIENT_CLINIC_OR_DEPARTMENT_OTHER): Payer: Self-pay | Admitting: *Deleted

## 2013-09-10 ENCOUNTER — Other Ambulatory Visit: Payer: Self-pay | Admitting: Orthopedic Surgery
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
## 2013-09-13 ENCOUNTER — Encounter (HOSPITAL_BASED_OUTPATIENT_CLINIC_OR_DEPARTMENT_OTHER): Payer: No Typology Code available for payment source | Admitting: Anesthesiology

## 2013-09-13 ENCOUNTER — Encounter (HOSPITAL_BASED_OUTPATIENT_CLINIC_OR_DEPARTMENT_OTHER)
Admission: RE | Disposition: A | Discharge status: Home / Self Care | Payer: Self-pay | Source: Ambulatory Visit | Attending: Orthopedic Surgery

## 2013-09-13 ENCOUNTER — Ambulatory Visit (HOSPITAL_BASED_OUTPATIENT_CLINIC_OR_DEPARTMENT_OTHER): Payer: No Typology Code available for payment source | Admitting: Anesthesiology

## 2013-09-13 ENCOUNTER — Ambulatory Visit (HOSPITAL_BASED_OUTPATIENT_CLINIC_OR_DEPARTMENT_OTHER)
Admission: RE | Admit: 2013-09-13 | Discharge: 2013-09-13 | Disposition: A | Discharge status: Home / Self Care | Payer: No Typology Code available for payment source | Source: Ambulatory Visit | Attending: Orthopedic Surgery | Admitting: Orthopedic Surgery

## 2013-09-13 ENCOUNTER — Encounter (HOSPITAL_BASED_OUTPATIENT_CLINIC_OR_DEPARTMENT_OTHER): Payer: Self-pay

## 2013-09-13 DIAGNOSIS — Z85828 Personal history of other malignant neoplasm of skin: Secondary | ICD-10-CM | POA: Insufficient documentation

## 2013-09-13 DIAGNOSIS — Z87442 Personal history of urinary calculi: Secondary | ICD-10-CM | POA: Insufficient documentation

## 2013-09-13 DIAGNOSIS — M25819 Other specified joint disorders, unspecified shoulder: Secondary | ICD-10-CM | POA: Insufficient documentation

## 2013-09-13 DIAGNOSIS — M75101 Unspecified rotator cuff tear or rupture of right shoulder, not specified as traumatic: Secondary | ICD-10-CM

## 2013-09-13 DIAGNOSIS — M7500 Adhesive capsulitis of unspecified shoulder: Secondary | ICD-10-CM | POA: Insufficient documentation

## 2013-09-13 DIAGNOSIS — Z96659 Presence of unspecified artificial knee joint: Secondary | ICD-10-CM | POA: Insufficient documentation

## 2013-09-13 DIAGNOSIS — S43429A Sprain of unspecified rotator cuff capsule, initial encounter: Secondary | ICD-10-CM | POA: Insufficient documentation

## 2013-09-13 DIAGNOSIS — H9190 Unspecified hearing loss, unspecified ear: Secondary | ICD-10-CM | POA: Insufficient documentation

## 2013-09-13 DIAGNOSIS — S43439A Superior glenoid labrum lesion of unspecified shoulder, initial encounter: Secondary | ICD-10-CM | POA: Insufficient documentation

## 2013-09-13 DIAGNOSIS — Z79899 Other long term (current) drug therapy: Secondary | ICD-10-CM | POA: Insufficient documentation

## 2013-09-13 HISTORY — DX: Unspecified hearing loss, unspecified ear: H91.90

## 2013-09-13 HISTORY — PX: SHOULDER ARTHROSCOPY WITH ROTATOR CUFF REPAIR AND SUBACROMIAL DECOMPRESSION: SHX5686

## 2013-09-13 LAB — POCT HEMOGLOBIN-HEMACUE: Hemoglobin: 15.5 g/dL (ref 13.0–17.0)

## 2013-09-13 SURGERY — SHOULDER ARTHROSCOPY WITH ROTATOR CUFF REPAIR AND SUBACROMIAL DECOMPRESSION
Anesthesia: General | Site: Shoulder | Laterality: Right | Wound class: Clean

## 2013-09-13 MED ORDER — MIDAZOLAM HCL 2 MG/2ML IJ SOLN
INTRAMUSCULAR | Status: AC
  Filled 2013-09-13: qty 2

## 2013-09-13 MED ORDER — SUCCINYLCHOLINE CHLORIDE 20 MG/ML IJ SOLN
INTRAMUSCULAR | Status: DC | PRN
  Administered 2013-09-13: 100 mg via INTRAVENOUS

## 2013-09-13 MED ORDER — DEXAMETHASONE SODIUM PHOSPHATE 4 MG/ML IJ SOLN
INTRAMUSCULAR | Status: DC | PRN
  Administered 2013-09-13: 10 mg via INTRAVENOUS

## 2013-09-13 MED ORDER — BUPIVACAINE-EPINEPHRINE PF 0.5-1:200000 % IJ SOLN
INTRAMUSCULAR | Status: DC | PRN
  Administered 2013-09-13: 30 mL

## 2013-09-13 MED ORDER — FENTANYL CITRATE 0.05 MG/ML IJ SOLN
100.0000 ug | Freq: Once | INTRAMUSCULAR | Status: AC
  Administered 2013-09-13: 100 ug via INTRAVENOUS

## 2013-09-13 MED ORDER — FENTANYL CITRATE 0.05 MG/ML IJ SOLN
INTRAMUSCULAR | Status: AC
  Filled 2013-09-13: qty 2

## 2013-09-13 MED ORDER — HYDROMORPHONE HCL PF 1 MG/ML IJ SOLN
1.0000 mg | INTRAMUSCULAR | Status: DC | PRN
  Administered 2013-09-13: 0.5 mg via INTRAVENOUS

## 2013-09-13 MED ORDER — HYDROMORPHONE HCL PF 1 MG/ML IJ SOLN: 0.2500 mg | INTRAMUSCULAR | Status: DC | PRN

## 2013-09-13 MED ORDER — CEFAZOLIN SODIUM-DEXTROSE 2-3 GM-% IV SOLR
INTRAVENOUS | Status: AC
  Filled 2013-09-13: qty 50

## 2013-09-13 MED ORDER — LIDOCAINE HCL (CARDIAC) 20 MG/ML IV SOLN
INTRAVENOUS | Status: DC | PRN
  Administered 2013-09-13: 25 mg via INTRAVENOUS

## 2013-09-13 MED ORDER — LACTATED RINGERS IV SOLN
INTRAVENOUS | Status: DC
  Administered 2013-09-13: 12:00:00 via INTRAVENOUS
  Administered 2013-09-13: 10 mL/h via INTRAVENOUS
  Administered 2013-09-13: 13:00:00 via INTRAVENOUS

## 2013-09-13 MED ORDER — SODIUM CHLORIDE 0.9 % IR SOLN
Status: DC | PRN
  Administered 2013-09-13: 12000 mL

## 2013-09-13 MED ORDER — DOCUSATE SODIUM 100 MG PO CAPS: 100.0000 mg | ORAL_CAPSULE | Freq: Three times a day (TID) | ORAL | Status: DC | PRN

## 2013-09-13 MED ORDER — MIDAZOLAM HCL 2 MG/2ML IJ SOLN
1.0000 mg | INTRAMUSCULAR | Status: DC | PRN
  Administered 2013-09-13: 2 mg via INTRAVENOUS

## 2013-09-13 MED ORDER — CEFAZOLIN SODIUM-DEXTROSE 2-3 GM-% IV SOLR
2.0000 g | Freq: Once | INTRAVENOUS | Status: AC
  Administered 2013-09-13: 2 g via INTRAVENOUS

## 2013-09-13 MED ORDER — ONDANSETRON HCL 4 MG/2ML IJ SOLN
INTRAMUSCULAR | Status: DC | PRN
  Administered 2013-09-13: 4 mg via INTRAVENOUS

## 2013-09-13 MED ORDER — OXYCODONE HCL 5 MG PO TABS: 5.0000 mg | ORAL_TABLET | Freq: Once | ORAL | Status: DC | PRN

## 2013-09-13 MED ORDER — PHENYLEPHRINE HCL 10 MG/ML IJ SOLN
10.0000 mg | INTRAVENOUS | Status: DC | PRN
  Administered 2013-09-13: 50 ug/min via INTRAVENOUS

## 2013-09-13 MED ORDER — PROMETHAZINE HCL 25 MG/ML IJ SOLN: 6.2500 mg | INTRAMUSCULAR | Status: DC | PRN

## 2013-09-13 MED ORDER — MEPERIDINE HCL 25 MG/ML IJ SOLN: 6.2500 mg | INTRAMUSCULAR | Status: DC | PRN

## 2013-09-13 MED ORDER — OXYCODONE HCL 5 MG/5ML PO SOLN: 5.0000 mg | Freq: Once | ORAL | Status: DC | PRN

## 2013-09-13 MED ORDER — PROPOFOL 10 MG/ML IV BOLUS
INTRAVENOUS | Status: DC | PRN
  Administered 2013-09-13: 200 mg via INTRAVENOUS

## 2013-09-13 MED ORDER — OXYCODONE-ACETAMINOPHEN 5-325 MG PO TABS: 1.0000 | ORAL_TABLET | ORAL | Status: DC | PRN

## 2013-09-13 MED ORDER — MIDAZOLAM HCL 2 MG/2ML IJ SOLN: 0.5000 mg | Freq: Once | INTRAMUSCULAR | Status: DC | PRN

## 2013-09-13 MED ORDER — HYDROMORPHONE HCL PF 1 MG/ML IJ SOLN
INTRAMUSCULAR | Status: AC
  Filled 2013-09-13: qty 1

## 2013-09-13 MED ORDER — PHENYLEPHRINE HCL 10 MG/ML IJ SOLN
INTRAMUSCULAR | Status: DC | PRN
  Administered 2013-09-13 (×3): 40 ug via INTRAVENOUS

## 2013-09-13 MED ORDER — GLYCOPYRROLATE 0.2 MG/ML IJ SOLN
INTRAMUSCULAR | Status: DC | PRN
  Administered 2013-09-13: 0.2 mg via INTRAVENOUS

## 2013-09-13 SURGICAL SUPPLY — 88 items
ADH SKN CLS APL DERMABOND .7 (GAUZE/BANDAGES/DRESSINGS)
ANCH SUT 2 FT CRKSW 14.7X5.5 (Anchor) ×1 IMPLANT
ANCH SUT PUSHLCK 24X4.5 STRL (Orthopedic Implant) ×1 IMPLANT
ANCHOR CORKSCREW FIBER 5.5X15 (Anchor) ×1 IMPLANT
APL SKNCLS STERI-STRIP NONHPOA (GAUZE/BANDAGES/DRESSINGS)
BENZOIN TINCTURE PRP APPL 2/3 (GAUZE/BANDAGES/DRESSINGS) IMPLANT
BLADE SURG 15 STRL LF DISP TIS (BLADE) IMPLANT
BLADE SURG 15 STRL SS (BLADE)
BLADE SURG ROTATE 9660 (MISCELLANEOUS) IMPLANT
BUR OVAL 4.0 (BURR) ×2 IMPLANT
BUR OVAL 6.0 (BURR) ×2 IMPLANT
CANISTER SUCT 3000ML (MISCELLANEOUS) IMPLANT
CANISTER SUCT LVC 12 LTR MEDI- (MISCELLANEOUS) ×2 IMPLANT
CANNULA 5.75X71 LONG (CANNULA) ×2 IMPLANT
CANNULA TWIST IN 8.25X7CM (CANNULA) IMPLANT
CHLORAPREP W/TINT 26ML (MISCELLANEOUS) ×2 IMPLANT
DECANTER SPIKE VIAL GLASS SM (MISCELLANEOUS) IMPLANT
DERMABOND ADVANCED (GAUZE/BANDAGES/DRESSINGS)
DERMABOND ADVANCED .7 DNX12 (GAUZE/BANDAGES/DRESSINGS) IMPLANT
DRAPE INCISE IOBAN 66X45 STRL (DRAPES) ×2 IMPLANT
DRAPE STERI 35X30 U-POUCH (DRAPES) ×2 IMPLANT
DRAPE SURG 17X23 STRL (DRAPES) ×2 IMPLANT
DRAPE U 20/CS (DRAPES) ×2 IMPLANT
DRAPE U-SHAPE 47X51 STRL (DRAPES) ×2 IMPLANT
DRAPE U-SHAPE 76X120 STRL (DRAPES) ×4 IMPLANT
DRSG PAD ABDOMINAL 8X10 ST (GAUZE/BANDAGES/DRESSINGS) ×2 IMPLANT
ELECT REM PT RETURN 9FT ADLT (ELECTROSURGICAL) ×2
ELECTRODE REM PT RTRN 9FT ADLT (ELECTROSURGICAL) ×1 IMPLANT
GAUZE SPONGE 4X4 16PLY XRAY LF (GAUZE/BANDAGES/DRESSINGS) IMPLANT
GAUZE XEROFORM 1X8 LF (GAUZE/BANDAGES/DRESSINGS) ×2 IMPLANT
GLOVE BIO SURGEON STRL SZ7 (GLOVE) ×2 IMPLANT
GLOVE BIO SURGEON STRL SZ7.5 (GLOVE) ×2 IMPLANT
GLOVE BIOGEL M STRL SZ7.5 (GLOVE) ×1 IMPLANT
GLOVE BIOGEL PI IND STRL 7.0 (GLOVE) ×1 IMPLANT
GLOVE BIOGEL PI IND STRL 8 (GLOVE) ×1 IMPLANT
GLOVE BIOGEL PI INDICATOR 7.0 (GLOVE) ×1
GLOVE BIOGEL PI INDICATOR 8 (GLOVE) ×2
GLOVE ECLIPSE 6.5 STRL STRAW (GLOVE) ×1 IMPLANT
GOWN PREVENTION PLUS XLARGE (GOWN DISPOSABLE) ×4 IMPLANT
GOWN PREVENTION PLUS XXLARGE (GOWN DISPOSABLE) ×2 IMPLANT
NDL 1/2 CIR CATGUT .05X1.09 (NEEDLE) IMPLANT
NDL SCORPION MULTI FIRE (NEEDLE) IMPLANT
NDL SUT 6 .5 CRC .975X.05 MAYO (NEEDLE) IMPLANT
NEEDLE 1/2 CIR CATGUT .05X1.09 (NEEDLE) IMPLANT
NEEDLE MAYO TAPER (NEEDLE)
NEEDLE SCORPION MULTI FIRE (NEEDLE) ×2 IMPLANT
NS IRRIG 1000ML POUR BTL (IV SOLUTION) IMPLANT
PACK ARTHROSCOPY DSU (CUSTOM PROCEDURE TRAY) ×2 IMPLANT
PACK BASIN DAY SURGERY FS (CUSTOM PROCEDURE TRAY) ×2 IMPLANT
PENCIL BUTTON HOLSTER BLD 10FT (ELECTRODE) IMPLANT
PUSHLOCK PEEK 4.5X24 (Orthopedic Implant) ×1 IMPLANT
RESECTOR FULL RADIUS 4.2MM (BLADE) ×2 IMPLANT
SHEET MEDIUM DRAPE 40X70 STRL (DRAPES) ×2 IMPLANT
SLEEVE SCD COMPRESS KNEE MED (MISCELLANEOUS) ×2 IMPLANT
SLING ARM FOAM STRAP LRG (SOFTGOODS) IMPLANT
SLING ARM FOAM STRAP MED (SOFTGOODS) IMPLANT
SLING ARM FOAM STRAP XLG (SOFTGOODS) ×1 IMPLANT
SLING ARM IMMOBILIZER MED (SOFTGOODS) IMPLANT
SPONGE GAUZE 4X4 12PLY (GAUZE/BANDAGES/DRESSINGS) ×2 IMPLANT
SPONGE LAP 4X18 X RAY DECT (DISPOSABLE) IMPLANT
STRIP CLOSURE SKIN 1/2X4 (GAUZE/BANDAGES/DRESSINGS) IMPLANT
SUCTION FRAZIER TIP 10 FR DISP (SUCTIONS) IMPLANT
SUPPORT WRAP ARM LG (MISCELLANEOUS) IMPLANT
SUT BONE WAX W31G (SUTURE) IMPLANT
SUT ETHIBOND 2 OS 4 DA (SUTURE) IMPLANT
SUT ETHILON 3 0 PS 1 (SUTURE) ×4 IMPLANT
SUT ETHILON 4 0 PS 2 18 (SUTURE) IMPLANT
SUT FIBERWIRE #2 38 T-5 BLUE (SUTURE)
SUT FIBERWIRE 2-0 18 17.9 3/8 (SUTURE)
SUT MNCRL AB 3-0 PS2 18 (SUTURE) IMPLANT
SUT MNCRL AB 4-0 PS2 18 (SUTURE) IMPLANT
SUT PDS AB 0 CT 36 (SUTURE) IMPLANT
SUT PROLENE 3 0 PS 2 (SUTURE) IMPLANT
SUT VIC AB 0 CT1 27 (SUTURE)
SUT VIC AB 0 CT1 27XBRD ANBCTR (SUTURE) IMPLANT
SUT VIC AB 2-0 SH 27 (SUTURE)
SUT VIC AB 2-0 SH 27XBRD (SUTURE) IMPLANT
SUTURE FIBERWR #2 38 T-5 BLUE (SUTURE) IMPLANT
SUTURE FIBERWR 2-0 18 17.9 3/8 (SUTURE) IMPLANT
SYR BULB 3OZ (MISCELLANEOUS) IMPLANT
TAPE FIBER 2MM 7IN #2 BLUE (SUTURE) IMPLANT
TOWEL OR 17X24 6PK STRL BLUE (TOWEL DISPOSABLE) ×2 IMPLANT
TOWEL OR NON WOVEN STRL DISP B (DISPOSABLE) ×2 IMPLANT
TUBE CONNECTING 20X1/4 (TUBING) ×2 IMPLANT
TUBING ARTHROSCOPY IRRIG 16FT (MISCELLANEOUS) ×2 IMPLANT
WAND STAR VAC 90 (SURGICAL WAND) ×2 IMPLANT
WATER STERILE IRR 1000ML POUR (IV SOLUTION) ×2 IMPLANT
YANKAUER SUCT BULB TIP NO VENT (SUCTIONS) IMPLANT
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
## 2013-09-13 NOTE — Transfer of Care (Signed)
Immediate Anesthesia Transfer of Care Note  Patient: Ryan Shah  Procedure(s) Performed: Procedure(s) with comments: SHOULDER ARTHROSCOPY WITH ROTATOR CUFF REPAIR AND SUBACROMIAL DECOMPRESSION (Right) - Rigth shoulder arthroscopic rotator cuff repair, subacromail decompression, distal clavical excision.  Patient Location: PACU  Anesthesia Type:GA combined with regional for post-op pain  Level of Consciousness: sedated and patient cooperative  Airway & Oxygen Therapy: Patient Spontanous Breathing and Patient connected to face mask oxygen  Post-op Assessment: Report given to PACU RN and Post -op Vital signs reviewed and stable  Post vital signs: Reviewed and stable  Complications: No apparent anesthesia complications

## 2013-09-13 NOTE — Anesthesia Postprocedure Evaluation (Signed)
  Anesthesia Post-op Note  Patient: Ryan Shah  Procedure(s) Performed: Procedure(s) with comments: SHOULDER ARTHROSCOPY WITH ROTATOR CUFF REPAIR AND SUBACROMIAL DECOMPRESSION (Right) - Rigth shoulder arthroscopic rotator cuff repair, subacromail decompression, distal clavical excision.  Patient Location: PACU  Anesthesia Type:GA combined with regional for post-op pain  Level of Consciousness: awake, alert , oriented and patient cooperative  Airway and Oxygen Therapy: Patient Spontanous Breathing  Post-op Pain: none  Post-op Assessment: Post-op Vital signs reviewed, Patient's Cardiovascular Status Stable, Respiratory Function Stable, Patent Airway, No signs of Nausea or vomiting and Pain level controlled  Post-op Vital Signs: Reviewed and stable  Complications: No apparent anesthesia complications

## 2013-09-13 NOTE — Anesthesia Procedure Notes (Addendum)
Anesthesia Regional Block:  Interscalene brachial plexus block  Pre-Anesthetic Checklist: ,, timeout performed, Correct Patient, Correct Site, Correct Laterality, Correct Procedure, Correct Position, site marked, Risks and benefits discussed,  Surgical consent,  Pre-op evaluation,  At surgeon's request and post-op pain management  Laterality: Right     Needles:  Injection technique: Single-shot  Needle Type: Stimulator Needle - 40      Needle Gauge: 22 and 22 G    Additional Needles:  Procedures: nerve stimulator Interscalene brachial plexus block  Nerve Stimulator or Paresthesia:  Response: forearm twitch, 0.45 mA, 0.1 ms,   Additional Responses:   Narrative:  Start time: 09/13/2013 12:41 PM End time: 09/13/2013 12:48 PM Injection made incrementally with aspirations every 5 mL.  Performed by: Personally  Anesthesiologist: Sandford Craze, MD  Additional Notes: Pt identified in Holding room.  Monitors applied. Working IV access confirmed. Sterile prep R neck.  #22ga PNS to forearm twitch at 0.58mA threshold.  30cc 0.5% Bupivacaine with 1:200k epi injected incrementally after negative test dose.  Patient asymptomatic, VSS, no heme aspirated, tolerated well.  Sandford Craze <MD  Interscalene brachial plexus block Procedure Name: Intubation Date/Time: 09/13/2013 1:11 PM Performed by: Gar Gibbon Pre-anesthesia Checklist: Patient identified, Emergency Drugs available, Suction available and Patient being monitored Patient Re-evaluated:Patient Re-evaluated prior to inductionOxygen Delivery Method: Circle System Utilized Preoxygenation: Pre-oxygenation with 100% oxygen Intubation Type: IV induction Ventilation: Mask ventilation without difficulty Laryngoscope Size: Miller and 2 Grade View: Grade II Tube type: Oral Tube size: 8.0 mm Number of attempts: 1 Airway Equipment and Method: stylet and oral airway Placement Confirmation: ETT inserted through vocal cords under direct  vision,  positive ETCO2 and breath sounds checked- equal and bilateral Secured at: 23 cm Tube secured with: Tape Dental Injury: Teeth and Oropharynx as per pre-operative assessment

## 2013-09-13 NOTE — Progress Notes (Signed)
Assisted Dr. Jackson with right, interscalene  block. Side rails up, monitors on throughout procedure. See vital signs in flow sheet. Tolerated Procedure well. 
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
## 2013-09-15 ENCOUNTER — Encounter (HOSPITAL_BASED_OUTPATIENT_CLINIC_OR_DEPARTMENT_OTHER): Payer: Self-pay | Admitting: Orthopedic Surgery

## 2014-04-15 IMAGING — CR DG ANKLE COMPLETE 3+V*R*
2 series · 2 of 2 positions shown · non-contrast
Comparison: Intraoperative views 04/08/2013

CLINICAL DATA: Postop ankle fracture

RIGHT ANKLE - COMPLETE 3+ VIEW

[AP]
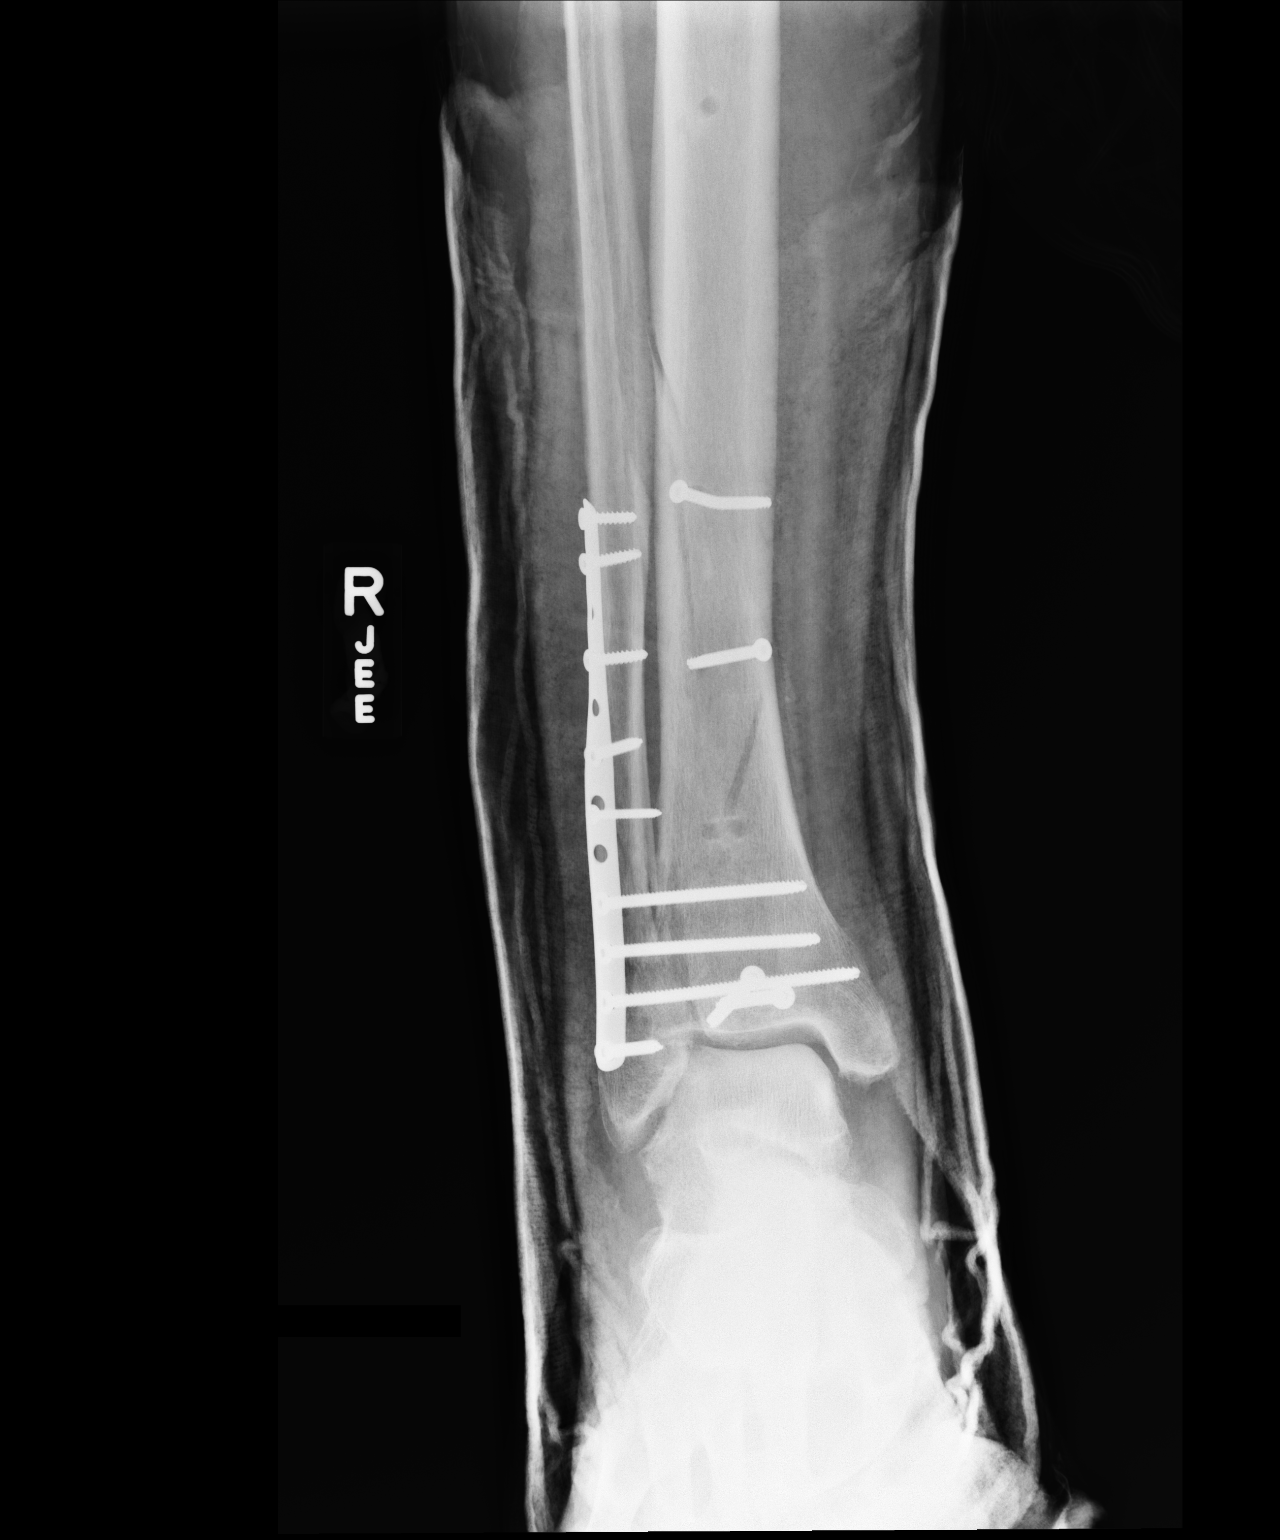

[lateral]
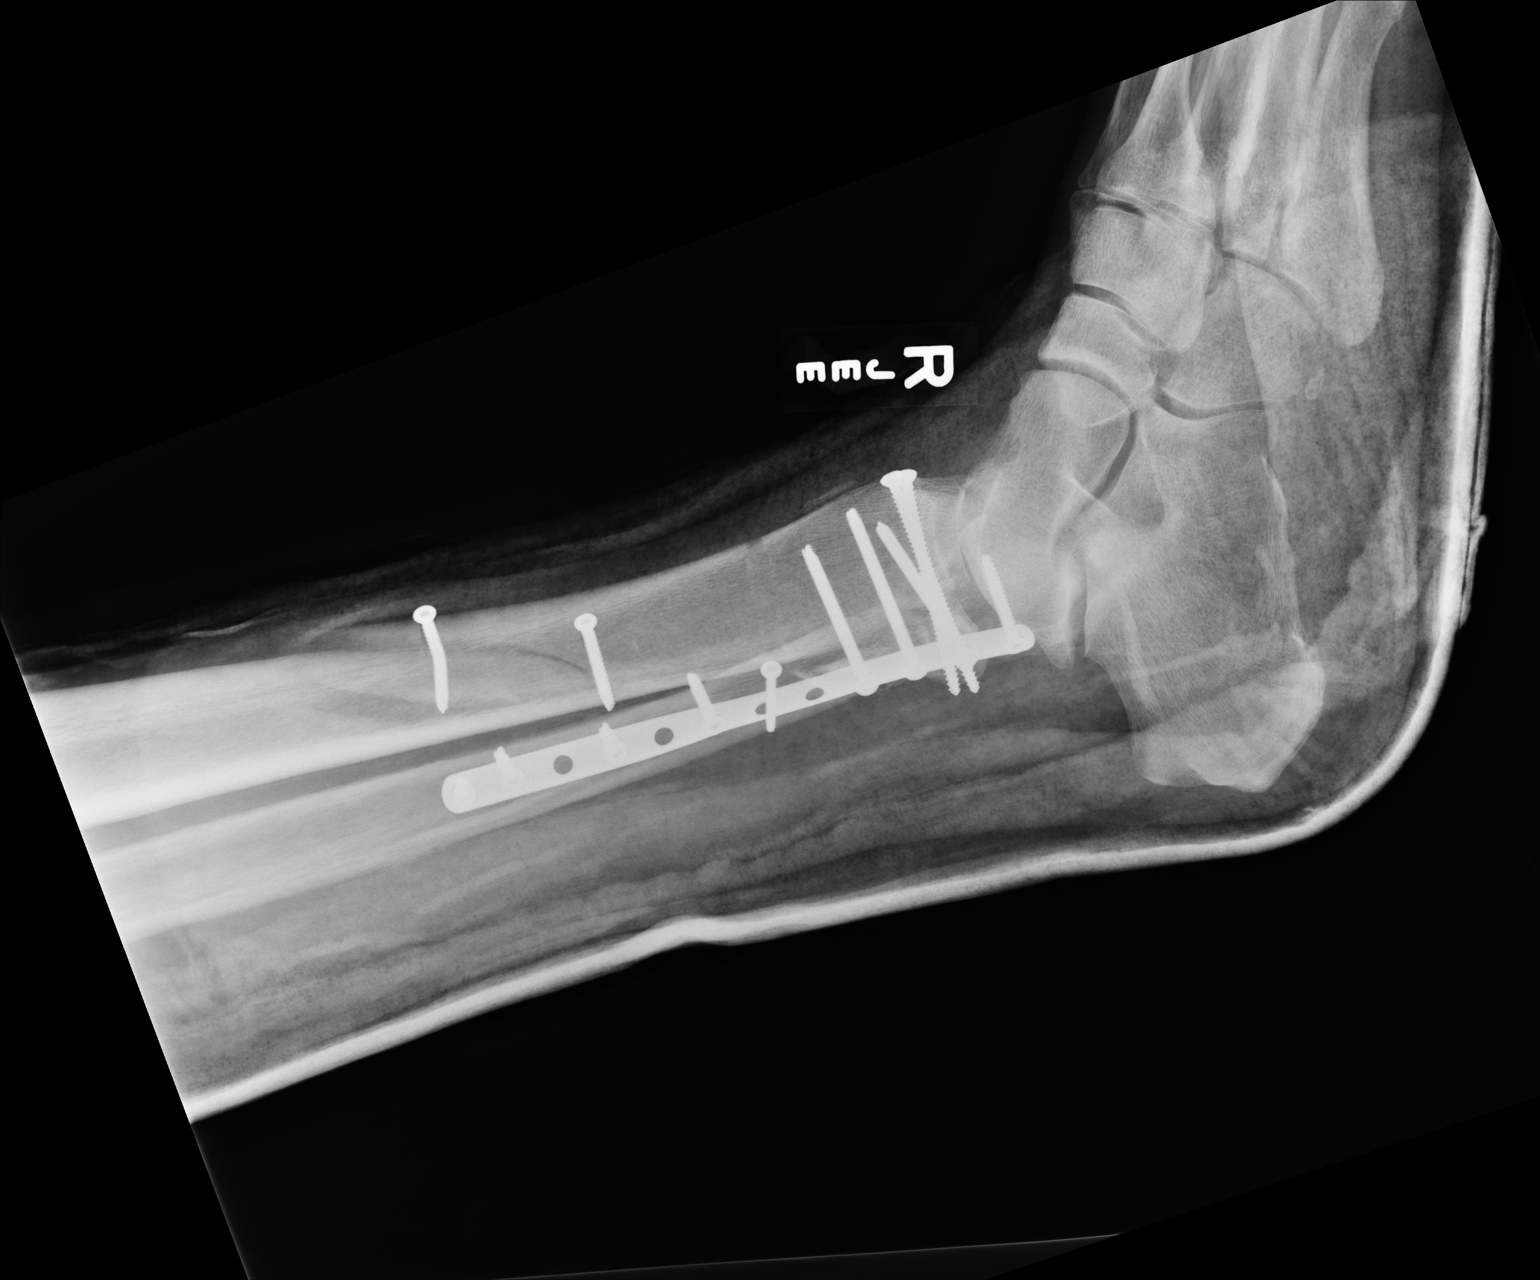

[2 of 2 positions shown; findings below may reference images not displayed]

FINDINGS: Splenic material overlies the ankle.  There is dynamic
plate fixation of the fibula with cortical screws spanning the
fibular and tibial metaphysis.  Additional cortical screws are
present within the tibia.   Ankle mortise intact.
IMPRESSION: Open reduction internal fixation of right ankle
fracture with without complication.

## 2014-04-15 IMAGING — CR DG CHEST 1V PORT
1 series · 1 of 1 positions shown · non-contrast
Comparison: None.

CLINICAL DATA: Postop.  Follow-up pneumothorax.

CHEST - 1 VIEW

[AP]
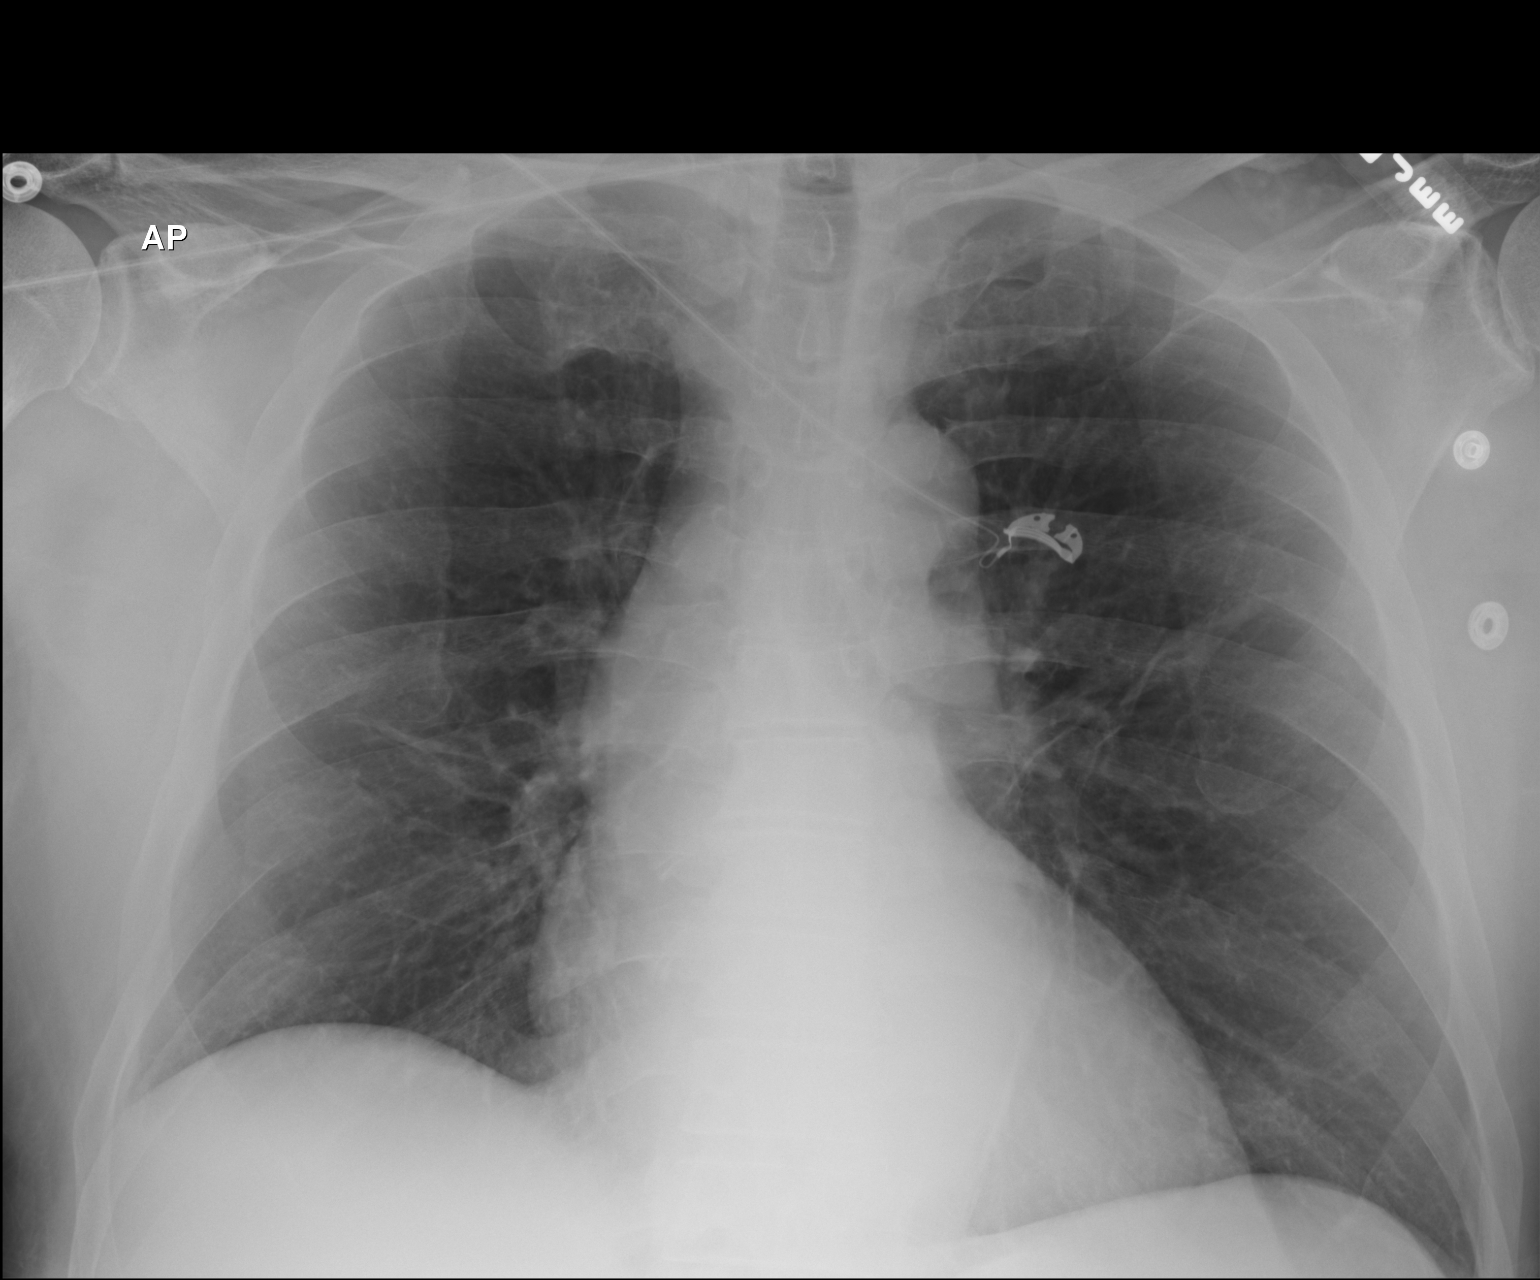

[1 of 1 positions shown; findings below may reference images not displayed]

FINDINGS: The heart size and mediastinal contours are within normal
limits.  No pneumothorax identified.  Mild atelectasis is seen in
the perihilar regions bilaterally.  Lungs are otherwise clear.  No
evidence of pleural effusion.
IMPRESSION: Mild bilateral perihilar atelectasis.  No pneumothorax identified.

## 2014-12-07 DIAGNOSIS — C61 Malignant neoplasm of prostate: Secondary | ICD-10-CM

## 2014-12-07 HISTORY — DX: Malignant neoplasm of prostate: C61

## 2018-12-29 ENCOUNTER — Ambulatory Visit (INDEPENDENT_AMBULATORY_CARE_PROVIDER_SITE_OTHER): Payer: Medicare Other | Admitting: Cardiology

## 2018-12-29 ENCOUNTER — Encounter: Payer: Self-pay | Admitting: Cardiology

## 2018-12-29 VITALS — BP 122/70 | HR 62 | Ht 75.0 in | Wt 245.0 lb

## 2018-12-29 DIAGNOSIS — I10 Essential (primary) hypertension: Secondary | ICD-10-CM | POA: Insufficient documentation

## 2018-12-29 DIAGNOSIS — R079 Chest pain, unspecified: Secondary | ICD-10-CM

## 2018-12-29 HISTORY — DX: Chest pain, unspecified: R07.9

## 2018-12-29 HISTORY — DX: Essential (primary) hypertension: I10
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
## 2018-12-29 NOTE — Addendum Note (Signed)
Addended by: Orland Penman on: 12/29/2018 04:09 PM   Modules accepted: Orders

## 2018-12-29 NOTE — Patient Instructions (Addendum)
Medication Instructions:   Your physician recommends that you continue on your current medications as directed. Please refer to the Current Medication list given to you today.   If you need a refill on your cardiac medications before your next appointment, please call your pharmacy.   Lab work:  NONE  Testing/Procedures:  Your physician has requested that you have a stress echocardiogram. For further information please visit [email or web address]. Please follow instruction sheet as given.    Follow-Up: At [facility], you and your health needs are our priority.  As part of our continuing mission to provide you with exceptional heart care, we have created designated Provider Care Teams.  These Care Teams include your primary Cardiologist (physician) and Advanced Practice Providers (APPs -  Physician Assistants and Nurse Practitioners) who all work together to provide you with the care you need, when you need it. . You will need a follow up appointment in 6 months.  Please call our office 2 months in advance to schedule this appointment.    Any Other Special Instructions Will Be Listed Below (If Applicable).   Exercise Stress Echocardiogram  An exercise stress echocardiogram is a test to check how well your heart is working. This test uses sound waves (ultrasound) and a computer to make images of your heart before and after exercise. Ultrasound images that are taken before you exercise (your resting echocardiogram) will show how much blood is getting to your heart muscle and how well your heart muscle and heart valves are functioning. During the next part of this test, you will walk on a treadmill or ride a stationary bike to see how exercise affects your heart. While you exercise, the electrical activity of your heart will be monitored with an electrocardiogram (ECG). Your blood pressure will also be monitored. You may have this test if you:  Have chest pain or other symptoms of a  heart problem.  Recently had a heart attack or heart surgery.  Have heart valve problems.  Have a condition that causes narrowing of the blood vessels that supply your heart (coronary artery disease).  Have a high risk of heart disease and are starting a new exercise program.  Have a high risk of heart disease and need to have major surgery. Tell a health care provider about:  Any allergies you have.  All medicines you are taking, including vitamins, herbs, eye drops, creams, and over-the-counter medicines.  Any problems you or family members have had with anesthetic medicines.  Any blood disorders you have.  Any surgeries you have had.  Any medical conditions you have.  Whether you are pregnant or may be pregnant. What are the risks? Generally, this is a safe procedure. However, problems may occur, including:  Chest pain.  Dizziness or light-headedness.  Shortness of breath.  Increased or irregular heartbeat (palpitations).  Nausea or vomiting.  Heart attack (very rare). What happens before the procedure?  Follow instructions from your health care provider about eating or drinking restrictions. You may be asked to avoid all forms of caffeine for 24 hours before your procedure, or as told by your health care provider.  Ask your health care provider about changing or stopping your regular medicines. This is especially important if you are taking diabetes medicines or blood thinners.  If you use an inhaler, bring it with you to the test.  Wear loose, comfortable clothing and walking shoes.  Do notuse any products that contain nicotine or tobacco, such as cigarettes and e-cigarettes, for 4  hours before the test or as told by your health care provider. If you need help quitting, ask your health care provider. What happens during the procedure?  You will take off your clothes from the waist up and put on a hospital gown.  A technician will place electrodes on your  chest.  A blood pressure cuff will be placed on your arm.  You will lie down on a table for an ultrasound exam before you exercise. Gel will be rubbed on your chest, and a handheld device (transducer) will be pressed against your chest and moved over your heart.  Then, you will start exercising by walking on a treadmill or pedaling a stationary bicycle.  Your blood pressure and heart rhythm will be monitored while you exercise.  The exercise will gradually get harder or faster.  You will exercise until: ? Your heart reaches a target level. ? You are too tired to continue. ? You cannot continue because of chest pain, weakness, or dizziness.  You will have another ultrasound exam after you stop exercising. The procedure may vary among health care providers and hospitals. What happens after the procedure?  Your heart rate and blood pressure will be monitored until they return to your normal levels. Summary  An exercise stress echocardiogram is a test that uses ultrasound to check how well your heart works before and after exercise.  Before the test, follow instructions from your health care provider about stopping medications, avoiding nicotine and tobacco, and avoiding certain foods and drinks.  During the test, your blood pressure and heart rhythm will be monitored while you exercise on a treadmill or stationary bicycle. This information is not intended to replace advice given to you by your health care provider. Make sure you discuss any questions you have with your health care provider. Document Released: 11/01/2004 Document Revised: 06/19/2016 Document Reviewed: 06/19/2016 Elsevier Interactive Patient Education  2019 [employer].

## 2019-01-15 ENCOUNTER — Ambulatory Visit (INDEPENDENT_AMBULATORY_CARE_PROVIDER_SITE_OTHER): Payer: Medicare Other

## 2019-01-15 DIAGNOSIS — I10 Essential (primary) hypertension: Secondary | ICD-10-CM | POA: Diagnosis not present

## 2019-01-15 DIAGNOSIS — R079 Chest pain, unspecified: Secondary | ICD-10-CM

## 2019-01-15 LAB — ECHOCARDIOGRAM STRESS TEST
CSEPEDS: 1 s
CSEPEW: 10.1 METS
CSEPPHR: 146 {beats}/min
Exercise duration (min): 8 min
MPHR: 146 {beats}/min
Percent HR: 100 %
RPE: 15
Rest HR: 62 {beats}/min

## 2019-01-15 NOTE — Progress Notes (Signed)
Stress echocardiogram with limited exam has been performed.  Jimmy Tyrique Sporn RDCS, RVT

## 2019-01-21 ENCOUNTER — Telehealth: Payer: Self-pay

## 2019-01-21 NOTE — Telephone Encounter (Signed)
-----   Message from Jenean Lindau, MD sent at 01/18/2019 10:44 AM EDT ----- The results of the study is unremarkable. Please inform patient. I will discuss in detail at next appointment. Cc  primary care/referring physician Jenean Lindau, MD 01/18/2019 10:44 AM

## 2019-01-21 NOTE — Telephone Encounter (Signed)
Called patient and left a detailed voice message on patients phone regarding test results.

## 2019-06-30 ENCOUNTER — Ambulatory Visit: Payer: Medicare Other | Admitting: Cardiology

## 2019-08-24 ENCOUNTER — Ambulatory Visit: Payer: Medicare Other | Admitting: Cardiology

## 2019-08-25 ENCOUNTER — Encounter: Payer: Self-pay | Admitting: Cardiology

## 2019-08-25 ENCOUNTER — Ambulatory Visit (INDEPENDENT_AMBULATORY_CARE_PROVIDER_SITE_OTHER): Payer: Medicare Other | Admitting: Cardiology

## 2019-08-25 ENCOUNTER — Other Ambulatory Visit: Payer: Self-pay

## 2019-08-25 VITALS — BP 130/78 | HR 77 | Ht 75.0 in | Wt 211.4 lb

## 2019-08-25 DIAGNOSIS — I10 Essential (primary) hypertension: Secondary | ICD-10-CM | POA: Diagnosis not present

## 2019-08-25 NOTE — Patient Instructions (Signed)
Medication Instructions:  Your physician recommends that you continue on your current medications as directed. Please refer to the Current Medication list given to you today.  If you need a refill on your cardiac medications before your next appointment, please call your pharmacy.   Lab work: NONE If you have labs (blood work) drawn today and your tests are completely normal, you will receive your results only by: Marland Kitchen MyChart Message (if you have MyChart) OR . A paper copy in the mail If you have any lab test that is abnormal or we need to change your treatment, we will call you to review the results.  Testing/Procedures: NONE  Follow-Up: At [facility], you and your health needs are our priority.  As part of our continuing mission to provide you with exceptional heart care, we have created designated Provider Care Teams.  These Care Teams include your primary Cardiologist (physician) and Advanced Practice Providers (APPs -  Physician Assistants and Nurse Practitioners) who all work together to provide you with the care you need, when you need it. You will need a follow up appointment in 12 months.
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
## 2019-10-12 DIAGNOSIS — I351 Nonrheumatic aortic (valve) insufficiency: Secondary | ICD-10-CM

## 2019-10-12 DIAGNOSIS — I361 Nonrheumatic tricuspid (valve) insufficiency: Secondary | ICD-10-CM | POA: Diagnosis not present

## 2019-10-12 DIAGNOSIS — G454 Transient global amnesia: Secondary | ICD-10-CM | POA: Diagnosis not present

## 2019-10-12 DIAGNOSIS — G9340 Encephalopathy, unspecified: Secondary | ICD-10-CM | POA: Diagnosis not present

## 2019-10-12 DIAGNOSIS — I10 Essential (primary) hypertension: Secondary | ICD-10-CM | POA: Diagnosis not present

## 2019-10-13 DIAGNOSIS — G9340 Encephalopathy, unspecified: Secondary | ICD-10-CM | POA: Diagnosis not present

## 2019-10-13 DIAGNOSIS — I10 Essential (primary) hypertension: Secondary | ICD-10-CM | POA: Diagnosis not present

## 2019-10-13 DIAGNOSIS — G454 Transient global amnesia: Secondary | ICD-10-CM | POA: Diagnosis not present

## 2020-01-19 DIAGNOSIS — C61 Malignant neoplasm of prostate: Secondary | ICD-10-CM | POA: Diagnosis not present

## 2020-01-19 DIAGNOSIS — I861 Scrotal varices: Secondary | ICD-10-CM | POA: Diagnosis not present

## 2020-01-19 DIAGNOSIS — N281 Cyst of kidney, acquired: Secondary | ICD-10-CM | POA: Diagnosis not present

## 2020-01-21 DIAGNOSIS — N281 Cyst of kidney, acquired: Secondary | ICD-10-CM | POA: Diagnosis not present

## 2020-01-21 DIAGNOSIS — N1330 Unspecified hydronephrosis: Secondary | ICD-10-CM | POA: Diagnosis not present

## 2020-01-27 DIAGNOSIS — K645 Perianal venous thrombosis: Secondary | ICD-10-CM | POA: Diagnosis not present

## 2020-01-27 DIAGNOSIS — E663 Overweight: Secondary | ICD-10-CM | POA: Diagnosis not present

## 2020-01-27 DIAGNOSIS — Z6826 Body mass index (BMI) 26.0-26.9, adult: Secondary | ICD-10-CM | POA: Diagnosis not present

## 2020-02-09 DIAGNOSIS — S300XXA Contusion of lower back and pelvis, initial encounter: Secondary | ICD-10-CM | POA: Diagnosis not present

## 2020-02-09 DIAGNOSIS — Z6827 Body mass index (BMI) 27.0-27.9, adult: Secondary | ICD-10-CM | POA: Diagnosis not present

## 2020-02-09 DIAGNOSIS — E663 Overweight: Secondary | ICD-10-CM | POA: Diagnosis not present

## 2020-02-09 DIAGNOSIS — S79911A Unspecified injury of right hip, initial encounter: Secondary | ICD-10-CM | POA: Diagnosis not present

## 2020-05-29 DIAGNOSIS — B309 Viral conjunctivitis, unspecified: Secondary | ICD-10-CM | POA: Diagnosis not present

## 2020-07-28 DIAGNOSIS — C61 Malignant neoplasm of prostate: Secondary | ICD-10-CM | POA: Diagnosis not present

## 2020-07-28 DIAGNOSIS — N281 Cyst of kidney, acquired: Secondary | ICD-10-CM | POA: Diagnosis not present

## 2020-09-20 DIAGNOSIS — I10 Essential (primary) hypertension: Secondary | ICD-10-CM | POA: Diagnosis not present

## 2020-10-10 DIAGNOSIS — J329 Chronic sinusitis, unspecified: Secondary | ICD-10-CM | POA: Diagnosis not present

## 2020-10-10 DIAGNOSIS — I10 Essential (primary) hypertension: Secondary | ICD-10-CM | POA: Diagnosis not present

## 2020-10-10 DIAGNOSIS — J40 Bronchitis, not specified as acute or chronic: Secondary | ICD-10-CM | POA: Diagnosis not present

## 2020-12-15 DIAGNOSIS — Z23 Encounter for immunization: Secondary | ICD-10-CM | POA: Diagnosis not present

## 2021-01-26 DIAGNOSIS — N281 Cyst of kidney, acquired: Secondary | ICD-10-CM | POA: Diagnosis not present

## 2021-01-26 DIAGNOSIS — C61 Malignant neoplasm of prostate: Secondary | ICD-10-CM | POA: Diagnosis not present

## 2021-02-14 ENCOUNTER — Ambulatory Visit: Payer: Medicare Other | Admitting: Cardiology

## 2021-03-02 DIAGNOSIS — R350 Frequency of micturition: Secondary | ICD-10-CM | POA: Diagnosis not present

## 2021-04-23 DIAGNOSIS — H9190 Unspecified hearing loss, unspecified ear: Secondary | ICD-10-CM | POA: Insufficient documentation

## 2021-04-23 DIAGNOSIS — S8261XA Displaced fracture of lateral malleolus of right fibula, initial encounter for closed fracture: Secondary | ICD-10-CM | POA: Insufficient documentation

## 2021-04-23 DIAGNOSIS — Z87442 Personal history of urinary calculi: Secondary | ICD-10-CM | POA: Insufficient documentation

## 2021-04-23 DIAGNOSIS — J939 Pneumothorax, unspecified: Secondary | ICD-10-CM | POA: Insufficient documentation

## 2021-04-23 DIAGNOSIS — I609 Nontraumatic subarachnoid hemorrhage, unspecified: Secondary | ICD-10-CM | POA: Insufficient documentation

## 2021-04-23 DIAGNOSIS — I6200 Nontraumatic subdural hemorrhage, unspecified: Secondary | ICD-10-CM | POA: Insufficient documentation

## 2021-04-23 DIAGNOSIS — C4491 Basal cell carcinoma of skin, unspecified: Secondary | ICD-10-CM | POA: Insufficient documentation

## 2021-04-23 DIAGNOSIS — S2249XA Multiple fractures of ribs, unspecified side, initial encounter for closed fracture: Secondary | ICD-10-CM | POA: Insufficient documentation

## 2021-04-23 DIAGNOSIS — I82403 Acute embolism and thrombosis of unspecified deep veins of lower extremity, bilateral: Secondary | ICD-10-CM | POA: Insufficient documentation

## 2021-04-23 DIAGNOSIS — S022XXA Fracture of nasal bones, initial encounter for closed fracture: Secondary | ICD-10-CM | POA: Insufficient documentation

## 2021-04-25 ENCOUNTER — Encounter: Payer: Self-pay | Admitting: Cardiology

## 2021-04-25 ENCOUNTER — Ambulatory Visit (INDEPENDENT_AMBULATORY_CARE_PROVIDER_SITE_OTHER): Payer: Medicare PPO | Admitting: Cardiology

## 2021-04-25 ENCOUNTER — Other Ambulatory Visit: Payer: Self-pay

## 2021-04-25 VITALS — BP 126/82 | HR 63 | Ht 75.0 in | Wt 218.4 lb

## 2021-04-25 DIAGNOSIS — I10 Essential (primary) hypertension: Secondary | ICD-10-CM | POA: Diagnosis not present

## 2021-04-25 DIAGNOSIS — G4733 Obstructive sleep apnea (adult) (pediatric): Secondary | ICD-10-CM | POA: Insufficient documentation

## 2021-04-25 DIAGNOSIS — F411 Generalized anxiety disorder: Secondary | ICD-10-CM

## 2021-04-25 DIAGNOSIS — F4312 Post-traumatic stress disorder, chronic: Secondary | ICD-10-CM

## 2021-04-25 DIAGNOSIS — I82409 Acute embolism and thrombosis of unspecified deep veins of unspecified lower extremity: Secondary | ICD-10-CM | POA: Insufficient documentation

## 2021-04-25 DIAGNOSIS — Z008 Encounter for other general examination: Secondary | ICD-10-CM

## 2021-04-25 DIAGNOSIS — R011 Cardiac murmur, unspecified: Secondary | ICD-10-CM | POA: Diagnosis not present

## 2021-04-25 DIAGNOSIS — E669 Obesity, unspecified: Secondary | ICD-10-CM | POA: Insufficient documentation

## 2021-04-25 HISTORY — DX: Encounter for other general examination: Z00.8

## 2021-04-25 HISTORY — DX: Cardiac murmur, unspecified: R01.1

## 2021-04-25 HISTORY — DX: Generalized anxiety disorder: F41.1

## 2021-04-25 HISTORY — DX: Obstructive sleep apnea (adult) (pediatric): G47.33

## 2021-04-25 HISTORY — DX: Post-traumatic stress disorder, chronic: F43.12

## 2021-04-25 HISTORY — DX: Acute embolism and thrombosis of unspecified deep veins of unspecified lower extremity: I82.409

## 2021-04-25 HISTORY — DX: Obesity, unspecified: E66.9

## 2021-04-25 HISTORY — DX: Essential (primary) hypertension: I10

## 2021-04-25 NOTE — Patient Instructions (Signed)
Medication Instructions:  No medication changes. *If you need a refill on your cardiac medications before your next appointment, please call your pharmacy*   Lab Work: None ordered If you have labs (blood work) drawn today and your tests are completely normal, you will receive your results only by: [place] (if you have MyChart) OR A paper copy in the mail If you have any lab test that is abnormal or we need to change your treatment, we will call you to review the results.   Testing/Procedures: Your physician has requested that you have an echocardiogram. Echocardiography is a painless test that uses sound waves to create images of your heart. It provides your doctor with information about the size and shape of your heart and how well your heart's chambers and valves are working. This procedure takes approximately one hour. There are no restrictions for this procedure.    Follow-Up: At [facility], you and your health needs are our priority.  As part of our continuing mission to provide you with exceptional heart care, we have created designated Provider Care Teams.  These Care Teams include your primary Cardiologist (physician) and Advanced Practice Providers (APPs -  Physician Assistants and Nurse Practitioners) who all work together to provide you with the care you need, when you need it.  We recommend signing up for the patient portal called "MyChart".  Sign up information is provided on this After Visit Summary.  MyChart is used to connect with patients for Virtual Visits (Telemedicine).  Patients are able to view lab/test results, encounter notes, upcoming appointments, etc.  Non-urgent messages can be sent to your provider as well.   To learn more about what you can do with MyChart, go to [email or web address].    Your next appointment:   12 month(s)  The format for your next appointment:   In Person  Provider:   Jyl Heinz, MD   Other  Instructions Echocardiogram An echocardiogram is a test that uses sound waves (ultrasound) to produce images of the heart. Images from an echocardiogram can provide important information about: Heart size and shape. The size and thickness and movement of your heart's walls. Heart muscle function and strength. Heart valve function or if you have stenosis. Stenosis is when the heart valves are too narrow. If blood is flowing backward through the heart valves (regurgitation). A tumor or infectious growth around the heart valves. Areas of heart muscle that are not working well because of poor blood flow or injury from a heart attack. Aneurysm detection. An aneurysm is a weak or damaged part of an artery wall. The wall bulges out from the normal force of blood pumping through the body. Tell a health care provider about: Any allergies you have. All medicines you are taking, including vitamins, herbs, eye drops, creams, and over-the-counter medicines. Any blood disorders you have. Any surgeries you have had. Any medical conditions you have. Whether you are pregnant or may be pregnant. What are the risks? Generally, this is a safe test. However, problems may occur, including an allergic reaction to dye (contrast) that may be used during the test. What happens before the test? No specific preparation is needed. You may eat and drink normally. What happens during the test? You will take off your clothes from the waist up and put on a hospital gown. Electrodes or electrocardiogram (ECG)patches may be placed on your chest. The electrodes or patches are then connected to a device that monitors your heart rate and rhythm. You will  lie down on a table for an ultrasound exam. A gel will be applied to your chest to help sound waves pass through your skin. A handheld device, called a transducer, will be pressed against your chest and moved over your heart. The transducer produces sound waves that travel to  your heart and bounce back (or "echo" back) to the transducer. These sound waves will be captured in real-time and changed into images of your heart that can be viewed on a video monitor. The images will be recorded on a computer and reviewed by your health care provider. You may be asked to change positions or hold your breath for a short time. This makes it easier to get different views or better views of your heart. In some cases, you may receive contrast through an IV in one of your veins. This can improve the quality of the pictures from your heart. The procedure may vary among health care providers and hospitals.   What can I expect after the test? You may return to your normal, everyday life, including diet, activities, and medicines, unless your health care provider tells you not to do that. Follow these instructions at home: It is up to you to get the results of your test. Ask your health care provider, or the department that is doing the test, when your results will be ready. Keep all follow-up visits. This is important. Summary An echocardiogram is a test that uses sound waves (ultrasound) to produce images of the heart. Images from an echocardiogram can provide important information about the size and shape of your heart, heart muscle function, heart valve function, and other possible heart problems. You do not need to do anything to prepare before this test. You may eat and drink normally. After the echocardiogram is completed, you may return to your normal, everyday life, unless your health care provider tells you not to do that. This information is not intended to replace advice given to you by your health care provider. Make sure you discuss any questions you have with your health care provider. Document Revised: 06/20/2020 Document Reviewed: 06/20/2020 Elsevier Patient Education  2021 Elsevier Inc.   
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
## 2021-04-27 DIAGNOSIS — H25813 Combined forms of age-related cataract, bilateral: Secondary | ICD-10-CM | POA: Diagnosis not present

## 2021-05-16 ENCOUNTER — Other Ambulatory Visit: Payer: Self-pay

## 2021-05-16 ENCOUNTER — Ambulatory Visit (INDEPENDENT_AMBULATORY_CARE_PROVIDER_SITE_OTHER): Payer: Medicare PPO

## 2021-05-16 DIAGNOSIS — R011 Cardiac murmur, unspecified: Secondary | ICD-10-CM

## 2021-05-16 DIAGNOSIS — I77810 Thoracic aortic ectasia: Secondary | ICD-10-CM

## 2021-05-16 LAB — ECHOCARDIOGRAM COMPLETE
AR max vel: 2.06 cm2
AV Area VTI: 1.93 cm2
AV Area mean vel: 2.1 cm2
AV Mean grad: 8.5 mmHg
AV Peak grad: 17.7 mmHg
Ao pk vel: 2.11 m/s
Area-P 1/2: 2.42 cm2
P 1/2 time: 721 msec
S' Lateral: 2.4 cm

## 2021-05-22 DIAGNOSIS — R911 Solitary pulmonary nodule: Secondary | ICD-10-CM | POA: Diagnosis not present

## 2021-05-22 DIAGNOSIS — I712 Thoracic aortic aneurysm, without rupture: Secondary | ICD-10-CM | POA: Diagnosis not present

## 2021-05-22 DIAGNOSIS — I77810 Thoracic aortic ectasia: Secondary | ICD-10-CM | POA: Diagnosis not present

## 2021-05-22 DIAGNOSIS — R93422 Abnormal radiologic findings on diagnostic imaging of left kidney: Secondary | ICD-10-CM | POA: Diagnosis not present

## 2021-05-22 DIAGNOSIS — I700 Atherosclerosis of aorta: Secondary | ICD-10-CM | POA: Diagnosis not present

## 2021-05-22 DIAGNOSIS — R918 Other nonspecific abnormal finding of lung field: Secondary | ICD-10-CM | POA: Diagnosis not present

## 2021-05-24 ENCOUNTER — Telehealth: Payer: Self-pay | Admitting: *Deleted

## 2021-05-24 ENCOUNTER — Telehealth: Payer: Self-pay | Admitting: Cardiology

## 2021-05-24 DIAGNOSIS — H25812 Combined forms of age-related cataract, left eye: Secondary | ICD-10-CM | POA: Diagnosis not present

## 2021-05-24 DIAGNOSIS — H2511 Age-related nuclear cataract, right eye: Secondary | ICD-10-CM | POA: Diagnosis not present

## 2021-05-24 DIAGNOSIS — Z01818 Encounter for other preprocedural examination: Secondary | ICD-10-CM | POA: Diagnosis not present

## 2021-05-24 DIAGNOSIS — H43812 Vitreous degeneration, left eye: Secondary | ICD-10-CM | POA: Diagnosis not present

## 2021-05-24 DIAGNOSIS — H5704 Mydriasis: Secondary | ICD-10-CM | POA: Diagnosis not present

## 2021-05-24 NOTE — Telephone Encounter (Signed)
   [facility] Pre-operative Risk Assessment    Patient Name: Ryan Shah  DOB: Dec 17, 1945 MRN: [id number]  HEARTCARE STAFF:  - IMPORTANT!!!!!! Under Visit Info/Reason for Call, type in Other and utilize the format Clearance MM/DD/YY or Clearance TBD. Do not use dashes or single digits. - Please review there is not already an duplicate clearance open for this procedure. - If request is for dental extraction, please clarify the # of teeth to be extracted. - If the patient is currently at the dentist's office, call Pre-Op Callback Staff (MA/nurse) to input urgent request.  - If the patient is not currently in the dentist office, please route to the Pre-Op pool.  Request for surgical clearance:  What type of surgery is being performed?  CATARACT EXTRACTION BY PE, IOL - RIGHT THEN LEFT   When is this surgery scheduled?  06/08/2021  What type of clearance is required (medical clearance vs. Pharmacy clearance to hold med vs. Both)?  MEDICAL  Are there any medications that need to be held prior to surgery and how long? N/A  Practice name and name of physician performing surgery?  [facility] / DR. MINCEY  What is the office phone number?  [phone] EX: [phone]    7.   What is the office fax number?  [phone]  8.   Anesthesia type (None, local, MAC, general) ?  IV SEDATION    Jeanann Lewandowsky 05/24/2021, 3:53 PM  _________________________________________________________________   (provider comments below)

## 2021-05-24 NOTE — Telephone Encounter (Signed)
[facility] radiology. Staff was wondering did we see the results. I let him know the results aren't up yet. He is going to fax them over.

## 2021-05-24 NOTE — Telephone Encounter (Signed)
[name] from [facility] is calling with CT chest results for this pt

## 2021-05-24 NOTE — Telephone Encounter (Signed)
   Patient Name: Ryan Shah  DOB: 10/26/1946 MRN: [id number]  Primary Cardiologist: Jenean Lindau, MD  Chart reviewed as part of pre-operative protocol coverage. Cataract extractions are recognized in guidelines as low risk surgeries that do not typically require specific preoperative testing or holding of blood thinner therapy. Therefore, given past medical history and time since last visit, based on ACC/AHA guidelines, [name] would be at acceptable risk for the planned procedure without further cardiovascular testing.   I will route this recommendation to the requesting party via Epic fax function and remove from pre-op pool.  Please call with questions.  Abigail Butts, PA-C 05/24/2021, 4:57 PM

## 2021-05-29 NOTE — Telephone Encounter (Signed)
Please refax preoperative cardiac evaluation to request in office.  Please confirm fax was received.  Thank you.  Jossie Ng. Zackaria Burkey NP-C    05/29/2021, 3:52 PM [address] Office [phone] Fax [phone]

## 2021-05-29 NOTE — Telephone Encounter (Signed)
Manually refaxed clearance.   Can call to verify they received it.

## 2021-05-29 NOTE — Telephone Encounter (Signed)
Ryan Shah is calling requesting this clearance be faxed over to them to the fax number listed in the clearance due to them not being able to print from [employer]. Please advise.

## 2021-05-30 DIAGNOSIS — Z0000 Encounter for general adult medical examination without abnormal findings: Secondary | ICD-10-CM | POA: Diagnosis not present

## 2021-05-30 DIAGNOSIS — I10 Essential (primary) hypertension: Secondary | ICD-10-CM | POA: Diagnosis not present

## 2021-05-30 DIAGNOSIS — I712 Thoracic aortic aneurysm, without rupture: Secondary | ICD-10-CM | POA: Diagnosis not present

## 2021-05-30 DIAGNOSIS — T466X5A Adverse effect of antihyperlipidemic and antiarteriosclerotic drugs, initial encounter: Secondary | ICD-10-CM | POA: Diagnosis not present

## 2021-05-30 DIAGNOSIS — I700 Atherosclerosis of aorta: Secondary | ICD-10-CM | POA: Diagnosis not present

## 2021-05-30 DIAGNOSIS — E785 Hyperlipidemia, unspecified: Secondary | ICD-10-CM | POA: Diagnosis not present

## 2021-05-30 DIAGNOSIS — N2889 Other specified disorders of kidney and ureter: Secondary | ICD-10-CM | POA: Diagnosis not present

## 2021-05-30 DIAGNOSIS — G720 Drug-induced myopathy: Secondary | ICD-10-CM | POA: Diagnosis not present

## 2021-05-30 DIAGNOSIS — R918 Other nonspecific abnormal finding of lung field: Secondary | ICD-10-CM | POA: Diagnosis not present

## 2021-06-05 DIAGNOSIS — E785 Hyperlipidemia, unspecified: Secondary | ICD-10-CM | POA: Diagnosis not present

## 2021-06-05 DIAGNOSIS — R7301 Impaired fasting glucose: Secondary | ICD-10-CM | POA: Diagnosis not present

## 2021-06-05 DIAGNOSIS — Z79899 Other long term (current) drug therapy: Secondary | ICD-10-CM | POA: Diagnosis not present

## 2021-06-12 DIAGNOSIS — K7689 Other specified diseases of liver: Secondary | ICD-10-CM | POA: Diagnosis not present

## 2021-06-12 DIAGNOSIS — N2889 Other specified disorders of kidney and ureter: Secondary | ICD-10-CM | POA: Diagnosis not present

## 2021-06-12 DIAGNOSIS — N281 Cyst of kidney, acquired: Secondary | ICD-10-CM | POA: Diagnosis not present

## 2021-06-12 DIAGNOSIS — N200 Calculus of kidney: Secondary | ICD-10-CM | POA: Diagnosis not present

## 2021-06-12 DIAGNOSIS — E0590 Thyrotoxicosis, unspecified without thyrotoxic crisis or storm: Secondary | ICD-10-CM | POA: Diagnosis not present

## 2021-06-12 DIAGNOSIS — M47816 Spondylosis without myelopathy or radiculopathy, lumbar region: Secondary | ICD-10-CM | POA: Diagnosis not present

## 2021-06-14 DIAGNOSIS — Z09 Encounter for follow-up examination after completed treatment for conditions other than malignant neoplasm: Secondary | ICD-10-CM | POA: Diagnosis not present

## 2021-06-14 DIAGNOSIS — H25811 Combined forms of age-related cataract, right eye: Secondary | ICD-10-CM | POA: Diagnosis not present

## 2021-06-14 DIAGNOSIS — H2511 Age-related nuclear cataract, right eye: Secondary | ICD-10-CM | POA: Diagnosis not present

## 2021-07-06 DIAGNOSIS — H2512 Age-related nuclear cataract, left eye: Secondary | ICD-10-CM | POA: Diagnosis not present

## 2021-07-06 DIAGNOSIS — H25812 Combined forms of age-related cataract, left eye: Secondary | ICD-10-CM | POA: Diagnosis not present

## 2021-07-06 DIAGNOSIS — H5704 Mydriasis: Secondary | ICD-10-CM | POA: Diagnosis not present

## 2021-07-15 DIAGNOSIS — Z85828 Personal history of other malignant neoplasm of skin: Secondary | ICD-10-CM | POA: Diagnosis not present

## 2021-07-15 DIAGNOSIS — Z8546 Personal history of malignant neoplasm of prostate: Secondary | ICD-10-CM | POA: Diagnosis not present

## 2021-07-15 DIAGNOSIS — I10 Essential (primary) hypertension: Secondary | ICD-10-CM | POA: Diagnosis not present

## 2021-07-15 DIAGNOSIS — Z7982 Long term (current) use of aspirin: Secondary | ICD-10-CM | POA: Diagnosis not present

## 2021-07-15 DIAGNOSIS — Z801 Family history of malignant neoplasm of trachea, bronchus and lung: Secondary | ICD-10-CM | POA: Diagnosis not present

## 2021-07-31 DIAGNOSIS — C61 Malignant neoplasm of prostate: Secondary | ICD-10-CM | POA: Diagnosis not present

## 2021-07-31 DIAGNOSIS — N281 Cyst of kidney, acquired: Secondary | ICD-10-CM | POA: Diagnosis not present

## 2021-08-07 DIAGNOSIS — Z9049 Acquired absence of other specified parts of digestive tract: Secondary | ICD-10-CM | POA: Diagnosis not present

## 2021-08-07 DIAGNOSIS — N281 Cyst of kidney, acquired: Secondary | ICD-10-CM | POA: Diagnosis not present

## 2021-08-25 DIAGNOSIS — J069 Acute upper respiratory infection, unspecified: Secondary | ICD-10-CM | POA: Diagnosis not present

## 2021-08-29 DIAGNOSIS — J40 Bronchitis, not specified as acute or chronic: Secondary | ICD-10-CM | POA: Diagnosis not present

## 2021-08-29 DIAGNOSIS — J329 Chronic sinusitis, unspecified: Secondary | ICD-10-CM | POA: Diagnosis not present

## 2021-08-29 DIAGNOSIS — Z20828 Contact with and (suspected) exposure to other viral communicable diseases: Secondary | ICD-10-CM | POA: Diagnosis not present

## 2021-09-19 DIAGNOSIS — Z23 Encounter for immunization: Secondary | ICD-10-CM | POA: Diagnosis not present

## 2021-10-31 DIAGNOSIS — R519 Headache, unspecified: Secondary | ICD-10-CM | POA: Diagnosis not present

## 2021-10-31 DIAGNOSIS — R5382 Chronic fatigue, unspecified: Secondary | ICD-10-CM | POA: Diagnosis not present

## 2021-10-31 DIAGNOSIS — Z20828 Contact with and (suspected) exposure to other viral communicable diseases: Secondary | ICD-10-CM | POA: Diagnosis not present

## 2021-10-31 DIAGNOSIS — R509 Fever, unspecified: Secondary | ICD-10-CM | POA: Diagnosis not present

## 2022-01-01 DIAGNOSIS — L821 Other seborrheic keratosis: Secondary | ICD-10-CM | POA: Diagnosis not present

## 2022-01-01 DIAGNOSIS — L570 Actinic keratosis: Secondary | ICD-10-CM | POA: Diagnosis not present

## 2022-01-01 DIAGNOSIS — L578 Other skin changes due to chronic exposure to nonionizing radiation: Secondary | ICD-10-CM | POA: Diagnosis not present

## 2022-01-01 DIAGNOSIS — S76211A Strain of adductor muscle, fascia and tendon of right thigh, initial encounter: Secondary | ICD-10-CM | POA: Diagnosis not present

## 2022-01-01 DIAGNOSIS — Z6825 Body mass index (BMI) 25.0-25.9, adult: Secondary | ICD-10-CM | POA: Diagnosis not present

## 2022-01-30 DIAGNOSIS — N281 Cyst of kidney, acquired: Secondary | ICD-10-CM | POA: Diagnosis not present

## 2022-01-30 DIAGNOSIS — C61 Malignant neoplasm of prostate: Secondary | ICD-10-CM | POA: Diagnosis not present

## 2022-04-12 DIAGNOSIS — L578 Other skin changes due to chronic exposure to nonionizing radiation: Secondary | ICD-10-CM | POA: Diagnosis not present

## 2022-04-12 DIAGNOSIS — L570 Actinic keratosis: Secondary | ICD-10-CM | POA: Diagnosis not present

## 2022-04-26 DIAGNOSIS — D369 Benign neoplasm, unspecified site: Secondary | ICD-10-CM | POA: Diagnosis not present

## 2022-05-01 DIAGNOSIS — C441192 Basal cell carcinoma of skin of left lower eyelid, including canthus: Secondary | ICD-10-CM | POA: Diagnosis not present

## 2022-05-01 DIAGNOSIS — D485 Neoplasm of uncertain behavior of skin: Secondary | ICD-10-CM | POA: Diagnosis not present

## 2022-05-09 ENCOUNTER — Ambulatory Visit: Payer: Medicare PPO | Admitting: Cardiology

## 2022-05-09 ENCOUNTER — Encounter: Payer: Self-pay | Admitting: Cardiology

## 2022-05-09 VITALS — BP 128/70 | HR 61 | Ht 75.0 in | Wt 202.6 lb

## 2022-05-09 DIAGNOSIS — I7121 Aneurysm of the ascending aorta, without rupture: Secondary | ICD-10-CM | POA: Diagnosis not present

## 2022-05-09 DIAGNOSIS — I712 Thoracic aortic aneurysm, without rupture, unspecified: Secondary | ICD-10-CM | POA: Diagnosis not present

## 2022-05-09 DIAGNOSIS — G4733 Obstructive sleep apnea (adult) (pediatric): Secondary | ICD-10-CM | POA: Diagnosis not present

## 2022-05-09 DIAGNOSIS — I10 Essential (primary) hypertension: Secondary | ICD-10-CM

## 2022-05-09 NOTE — Patient Instructions (Signed)
Medication Instructions:  Your physician recommends that you continue on your current medications as directed. Please refer to the Current Medication list given to you today.  *If you need a refill on your cardiac medications before your next appointment, please call your pharmacy*   Lab Work: None ordered If you have labs (blood work) drawn today and your tests are completely normal, you will receive your results only by: [place] (if you have MyChart) OR A paper copy in the mail If you have any lab test that is abnormal or we need to change your treatment, we will call you to review the results.   Testing/Procedures: Non-Cardiac CT scanning, (CAT scanning), is a noninvasive, special x-ray that produces cross-sectional images of the body using x-rays and a computer. CT scans help physicians diagnose and treat medical conditions. For some CT exams, a contrast material is used to enhance visibility in the area of the body being studied. CT scans provide greater clarity and reveal more details than regular x-ray exams. This will be scheduled at [facility].   Follow-Up: At [facility], you and your health needs are our priority.  As part of our continuing mission to provide you with exceptional heart care, we have created designated Provider Care Teams.  These Care Teams include your primary Cardiologist (physician) and Advanced Practice Providers (APPs -  Physician Assistants and Nurse Practitioners) who all work together to provide you with the care you need, when you need it.  We recommend signing up for the patient portal called "MyChart".  Sign up information is provided on this After Visit Summary.  MyChart is used to connect with patients for Virtual Visits (Telemedicine).  Patients are able to view lab/test results, encounter notes, upcoming appointments, etc.  Non-urgent messages can be sent to your provider as well.   To learn more about what you can do with MyChart, go to  [email or web address].    Your next appointment:   6 month(s)  The format for your next appointment:   In Person  Provider:   Jyl Heinz, MD   Other Instructions NA
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
## 2022-05-16 ENCOUNTER — Encounter: Payer: Self-pay | Admitting: Cardiology

## 2022-05-21 DIAGNOSIS — I7121 Aneurysm of the ascending aorta, without rupture: Secondary | ICD-10-CM | POA: Diagnosis not present

## 2022-05-28 ENCOUNTER — Telehealth: Payer: Self-pay | Admitting: Cardiology

## 2022-05-28 NOTE — Telephone Encounter (Signed)
Pt would like to know his CT results. Pt states he was told he should have had them back within 48 hours of exam. Best number to reach patient is [phone].  Thank you!

## 2022-05-30 NOTE — Telephone Encounter (Signed)
Stable aneurysm. Repeat 6 months. Results reviewed with pt as per Dr. Julien Nordmann note.  Pt verbalized understanding and had no additional questions.

## 2022-06-03 DIAGNOSIS — Z7982 Long term (current) use of aspirin: Secondary | ICD-10-CM | POA: Diagnosis not present

## 2022-06-03 DIAGNOSIS — I719 Aortic aneurysm of unspecified site, without rupture: Secondary | ICD-10-CM | POA: Diagnosis not present

## 2022-06-03 DIAGNOSIS — M1990 Unspecified osteoarthritis, unspecified site: Secondary | ICD-10-CM | POA: Diagnosis not present

## 2022-06-03 DIAGNOSIS — I739 Peripheral vascular disease, unspecified: Secondary | ICD-10-CM | POA: Diagnosis not present

## 2022-06-03 DIAGNOSIS — I700 Atherosclerosis of aorta: Secondary | ICD-10-CM | POA: Diagnosis not present

## 2022-06-03 DIAGNOSIS — I10 Essential (primary) hypertension: Secondary | ICD-10-CM | POA: Diagnosis not present

## 2022-06-03 DIAGNOSIS — Z8546 Personal history of malignant neoplasm of prostate: Secondary | ICD-10-CM | POA: Diagnosis not present

## 2022-06-03 DIAGNOSIS — Z8249 Family history of ischemic heart disease and other diseases of the circulatory system: Secondary | ICD-10-CM | POA: Diagnosis not present

## 2022-06-07 DIAGNOSIS — E0590 Thyrotoxicosis, unspecified without thyrotoxic crisis or storm: Secondary | ICD-10-CM | POA: Diagnosis not present

## 2022-06-07 DIAGNOSIS — Z79899 Other long term (current) drug therapy: Secondary | ICD-10-CM | POA: Diagnosis not present

## 2022-06-07 DIAGNOSIS — E785 Hyperlipidemia, unspecified: Secondary | ICD-10-CM | POA: Diagnosis not present

## 2022-06-07 DIAGNOSIS — M21621 Bunionette of right foot: Secondary | ICD-10-CM | POA: Diagnosis not present

## 2022-06-07 DIAGNOSIS — J342 Deviated nasal septum: Secondary | ICD-10-CM | POA: Diagnosis not present

## 2022-06-07 DIAGNOSIS — Z9889 Other specified postprocedural states: Secondary | ICD-10-CM | POA: Diagnosis not present

## 2022-06-07 DIAGNOSIS — Z0000 Encounter for general adult medical examination without abnormal findings: Secondary | ICD-10-CM | POA: Diagnosis not present

## 2022-06-07 DIAGNOSIS — Z8781 Personal history of (healed) traumatic fracture: Secondary | ICD-10-CM | POA: Diagnosis not present

## 2022-06-07 DIAGNOSIS — R7301 Impaired fasting glucose: Secondary | ICD-10-CM | POA: Diagnosis not present

## 2022-06-12 DIAGNOSIS — I469 Cardiac arrest, cause unspecified: Secondary | ICD-10-CM | POA: Diagnosis not present

## 2022-06-12 DIAGNOSIS — R404 Transient alteration of awareness: Secondary | ICD-10-CM | POA: Diagnosis not present

## 2022-06-12 DIAGNOSIS — R0902 Hypoxemia: Secondary | ICD-10-CM | POA: Diagnosis not present

## 2022-06-13 ENCOUNTER — Telehealth: Payer: Self-pay | Admitting: Cardiology

## 2022-06-13 NOTE — Telephone Encounter (Signed)
Left VM for [employer] to callback,

## 2022-06-13 NOTE — Telephone Encounter (Signed)
Patient's wife coming in to let us know her husband, and our patient Ryan Shah has passed and she has some questions about a possible nexus letter (documentation in Dr Julien Nordmann box), If you would please call [phone] or [phone].

## 2022-06-13 NOTE — Telephone Encounter (Signed)
Spoke with Ryan Shah at [facility] affair in [place]. Ryan Shah advised that the nexus paper is relating pt's heart condition to agent organge. Ryan Shah states we have a month to complete. [name] pt's wife advised that Dr. Geraldo Pitter is willing to send records but not writing a letter.

## 2022-07-12 DIAGNOSIS — 419620001 Death: Secondary | SNOMED CT | POA: Diagnosis not present

## 2022-07-12 DEATH — deceased

## 2022-08-19 ENCOUNTER — Encounter: Payer: Self-pay | Admitting: Cardiology

## 2022-11-05 ENCOUNTER — Ambulatory Visit: Payer: Medicare PPO | Admitting: Cardiology

## 2022-11-08 ENCOUNTER — Ambulatory Visit: Payer: Medicare PPO | Admitting: Cardiology
